# Patient Record
Sex: Male | Born: 1975 | Race: White | Hispanic: No | Marital: Single | Smoking: Current every day smoker
Health system: Southern US, Community
[De-identification: ages and names within clinical notes are randomized; demographics above are authoritative.]

## PROBLEM LIST (undated history)

## (undated) DIAGNOSIS — I10 Essential (primary) hypertension: Secondary | ICD-10-CM

## (undated) HISTORY — PX: FINGER SURGERY: SHX640

---

## 2002-09-07 ENCOUNTER — Emergency Department (HOSPITAL_COMMUNITY): Admission: AD | Admit: 2002-09-07 | Discharge: 2002-09-07 | Payer: Self-pay | Admitting: Emergency Medicine

## 2004-02-23 ENCOUNTER — Emergency Department (HOSPITAL_COMMUNITY): Admission: EM | Admit: 2004-02-23 | Discharge: 2004-02-24 | Payer: Self-pay | Admitting: Emergency Medicine

## 2006-06-20 ENCOUNTER — Emergency Department (HOSPITAL_COMMUNITY): Admission: EM | Admit: 2006-06-20 | Discharge: 2006-06-20 | Payer: Self-pay | Admitting: Emergency Medicine

## 2020-09-10 ENCOUNTER — Encounter (HOSPITAL_COMMUNITY): Payer: Self-pay | Admitting: Emergency Medicine

## 2020-09-10 ENCOUNTER — Emergency Department (HOSPITAL_COMMUNITY): Payer: Managed Care, Other (non HMO)

## 2020-09-10 ENCOUNTER — Emergency Department (HOSPITAL_COMMUNITY)
Admission: EM | Admit: 2020-09-10 | Discharge: 2020-09-10 | Disposition: A | Discharge status: Home / Self Care | Payer: Managed Care, Other (non HMO) | Attending: Emergency Medicine | Admitting: Emergency Medicine

## 2020-09-10 DIAGNOSIS — M25512 Pain in left shoulder: Secondary | ICD-10-CM | POA: Diagnosis present

## 2020-09-10 DIAGNOSIS — Y92410 Unspecified street and highway as the place of occurrence of the external cause: Secondary | ICD-10-CM | POA: Insufficient documentation

## 2020-09-10 DIAGNOSIS — M542 Cervicalgia: Secondary | ICD-10-CM | POA: Diagnosis not present

## 2020-09-10 MED ORDER — NAPROXEN 500 MG PO TABS: 500.0000 mg | ORAL_TABLET | Freq: Two times a day (BID) | ORAL | 0 refills | Status: DC | PRN

## 2020-09-10 MED ORDER — LIDOCAINE 5 % EX PTCH: 1.0000 | MEDICATED_PATCH | Freq: Every day | CUTANEOUS | 0 refills | Status: DC | PRN

## 2020-09-10 MED ORDER — METHOCARBAMOL 500 MG PO TABS: 500.0000 mg | ORAL_TABLET | Freq: Three times a day (TID) | ORAL | 0 refills | Status: DC | PRN
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
## 2022-08-30 ENCOUNTER — Emergency Department (HOSPITAL_COMMUNITY): Payer: Managed Care, Other (non HMO)

## 2022-08-30 ENCOUNTER — Observation Stay (HOSPITAL_COMMUNITY)
Admission: EM | Admit: 2022-08-30 | Discharge: 2022-08-31 | Disposition: A | Discharge status: Home / Self Care | Payer: Managed Care, Other (non HMO) | Attending: Surgery | Admitting: Surgery

## 2022-08-30 ENCOUNTER — Encounter (HOSPITAL_COMMUNITY): Payer: Self-pay

## 2022-08-30 ENCOUNTER — Other Ambulatory Visit: Payer: Self-pay

## 2022-08-30 DIAGNOSIS — Z79899 Other long term (current) drug therapy: Secondary | ICD-10-CM | POA: Insufficient documentation

## 2022-08-30 DIAGNOSIS — F17210 Nicotine dependence, cigarettes, uncomplicated: Secondary | ICD-10-CM | POA: Insufficient documentation

## 2022-08-30 DIAGNOSIS — I10 Essential (primary) hypertension: Secondary | ICD-10-CM | POA: Diagnosis not present

## 2022-08-30 DIAGNOSIS — K6289 Other specified diseases of anus and rectum: Principal | ICD-10-CM

## 2022-08-30 DIAGNOSIS — K611 Rectal abscess: Secondary | ICD-10-CM | POA: Diagnosis present

## 2022-08-30 DIAGNOSIS — K610 Anal abscess: Secondary | ICD-10-CM | POA: Diagnosis present

## 2022-08-30 HISTORY — DX: Essential (primary) hypertension: I10

## 2022-08-30 LAB — CBC WITH DIFFERENTIAL/PLATELET
Abs Immature Granulocytes: 0.04 10*3/uL (ref 0.00–0.07)
Basophils Absolute: 0.1 10*3/uL (ref 0.0–0.1)
Basophils Relative: 0 %
Eosinophils Absolute: 0.3 10*3/uL (ref 0.0–0.5)
Eosinophils Relative: 3 %
HCT: 44.1 % (ref 39.0–52.0)
Hemoglobin: 14.8 g/dL (ref 13.0–17.0)
Immature Granulocytes: 0 %
Lymphocytes Relative: 19 %
Lymphs Abs: 2.1 10*3/uL (ref 0.7–4.0)
MCH: 34 pg (ref 26.0–34.0)
MCHC: 33.6 g/dL (ref 30.0–36.0)
MCV: 101.4 fL — ABNORMAL HIGH (ref 80.0–100.0)
Monocytes Absolute: 0.8 10*3/uL (ref 0.1–1.0)
Monocytes Relative: 7 %
Neutro Abs: 8 10*3/uL — ABNORMAL HIGH (ref 1.7–7.7)
Neutrophils Relative %: 71 %
Platelets: 267 10*3/uL (ref 150–400)
RBC: 4.35 MIL/uL (ref 4.22–5.81)
RDW: 13.9 % (ref 11.5–15.5)
WBC: 11.4 10*3/uL — ABNORMAL HIGH (ref 4.0–10.5)
nRBC: 0 % (ref 0.0–0.2)

## 2022-08-30 LAB — COMPREHENSIVE METABOLIC PANEL
ALT: 30 U/L (ref 0–44)
AST: 25 U/L (ref 15–41)
Albumin: 3.2 g/dL — ABNORMAL LOW (ref 3.5–5.0)
Alkaline Phosphatase: 64 U/L (ref 38–126)
Anion gap: 10 (ref 5–15)
BUN: 7 mg/dL (ref 6–20)
CO2: 24 mmol/L (ref 22–32)
Calcium: 9 mg/dL (ref 8.9–10.3)
Chloride: 103 mmol/L (ref 98–111)
Creatinine, Ser: 0.96 mg/dL (ref 0.61–1.24)
GFR, Estimated: >60 mL/min (ref >60)
Glucose, Bld: 101 mg/dL — ABNORMAL HIGH (ref 70–99)
Potassium: 4 mmol/L (ref 3.5–5.1)
Sodium: 137 mmol/L (ref 135–145)
Total Bilirubin: 0.4 mg/dL (ref 0.3–1.2)
Total Protein: 7.4 g/dL (ref 6.5–8.1)

## 2022-08-30 LAB — URINALYSIS, ROUTINE W REFLEX MICROSCOPIC
Bilirubin Urine: NEGATIVE
Glucose, UA: NEGATIVE mg/dL
Hgb urine dipstick: NEGATIVE
Ketones, ur: NEGATIVE mg/dL
Leukocytes,Ua: NEGATIVE
Nitrite: NEGATIVE
Protein, ur: NEGATIVE mg/dL
Specific Gravity, Urine: 1.02 (ref 1.005–1.030)
pH: 5 (ref 5.0–8.0)

## 2022-08-30 LAB — HIV ANTIBODY (ROUTINE TESTING W REFLEX): HIV Screen 4th Generation wRfx: NONREACTIVE

## 2022-08-30 LAB — LIPASE, BLOOD: Lipase: 36 U/L (ref 11–51)

## 2022-08-30 MED ORDER — SODIUM CHLORIDE 0.9 % IV BOLUS
1000.0000 mL | Freq: Once | INTRAVENOUS | Status: AC
  Administered 2022-08-30: 1000 mL via INTRAVENOUS

## 2022-08-30 MED ORDER — DIPHENHYDRAMINE HCL 25 MG PO CAPS: 25.0000 mg | ORAL_CAPSULE | Freq: Four times a day (QID) | ORAL | Status: DC | PRN

## 2022-08-30 MED ORDER — LORAZEPAM 2 MG/ML IJ SOLN: 1.0000 mg | INTRAMUSCULAR | Status: DC | PRN

## 2022-08-30 MED ORDER — ONDANSETRON 4 MG PO TBDP: 4.0000 mg | ORAL_TABLET | Freq: Four times a day (QID) | ORAL | Status: DC | PRN

## 2022-08-30 MED ORDER — ACETAMINOPHEN 500 MG PO TABS
1000.0000 mg | ORAL_TABLET | Freq: Four times a day (QID) | ORAL | Status: DC | PRN
  Filled 2022-08-30: qty 2

## 2022-08-30 MED ORDER — OXYCODONE HCL 5 MG PO TABS
5.0000 mg | ORAL_TABLET | ORAL | Status: DC | PRN
  Administered 2022-08-31: 10 mg via ORAL
  Filled 2022-08-30: qty 2

## 2022-08-30 MED ORDER — SODIUM CHLORIDE 0.9 % IV SOLN: INTRAVENOUS | Status: DC

## 2022-08-30 MED ORDER — DOCUSATE SODIUM 100 MG PO CAPS
100.0000 mg | ORAL_CAPSULE | Freq: Two times a day (BID) | ORAL | Status: DC
  Administered 2022-08-30 – 2022-08-31 (×3): 100 mg via ORAL
  Filled 2022-08-30 (×3): qty 1

## 2022-08-30 MED ORDER — ADULT MULTIVITAMIN W/MINERALS CH
1.0000 | ORAL_TABLET | Freq: Every day | ORAL | Status: DC
  Administered 2022-08-30 – 2022-08-31 (×2): 1 via ORAL
  Filled 2022-08-30 (×2): qty 1

## 2022-08-30 MED ORDER — ONDANSETRON HCL 4 MG/2ML IJ SOLN: 4.0000 mg | Freq: Four times a day (QID) | INTRAMUSCULAR | Status: DC | PRN

## 2022-08-30 MED ORDER — DIPHENHYDRAMINE HCL 50 MG/ML IJ SOLN: 25.0000 mg | Freq: Four times a day (QID) | INTRAMUSCULAR | Status: DC | PRN

## 2022-08-30 MED ORDER — LORAZEPAM 1 MG PO TABS: 1.0000 mg | ORAL_TABLET | ORAL | Status: DC | PRN

## 2022-08-30 MED ORDER — METRONIDAZOLE 500 MG/100ML IV SOLN
500.0000 mg | Freq: Two times a day (BID) | INTRAVENOUS | Status: DC
  Administered 2022-08-30 – 2022-08-31 (×3): 500 mg via INTRAVENOUS
  Filled 2022-08-30 (×3): qty 100

## 2022-08-30 MED ORDER — HYDRALAZINE HCL 20 MG/ML IJ SOLN: 10.0000 mg | INTRAMUSCULAR | Status: DC | PRN

## 2022-08-30 MED ORDER — THIAMINE HCL 100 MG/ML IJ SOLN
100.0000 mg | Freq: Every day | INTRAMUSCULAR | Status: DC
  Filled 2022-08-30: qty 2

## 2022-08-30 MED ORDER — MORPHINE SULFATE (PF) 2 MG/ML IV SOLN: 2.0000 mg | INTRAVENOUS | Status: DC | PRN

## 2022-08-30 MED ORDER — SODIUM CHLORIDE 0.9 % IV SOLN
2.0000 g | INTRAVENOUS | Status: DC
  Administered 2022-08-30 – 2022-08-31 (×2): 2 g via INTRAVENOUS
  Filled 2022-08-30 (×2): qty 20

## 2022-08-30 MED ORDER — IOHEXOL 350 MG/ML SOLN
75.0000 mL | Freq: Once | INTRAVENOUS | Status: AC | PRN
  Administered 2022-08-30: 75 mL via INTRAVENOUS

## 2022-08-30 MED ORDER — THIAMINE MONONITRATE 100 MG PO TABS
100.0000 mg | ORAL_TABLET | Freq: Every day | ORAL | Status: DC
  Administered 2022-08-30 – 2022-08-31 (×2): 100 mg via ORAL
  Filled 2022-08-30 (×2): qty 1

## 2022-08-30 MED ORDER — FOLIC ACID 1 MG PO TABS
1.0000 mg | ORAL_TABLET | Freq: Every day | ORAL | Status: DC
  Administered 2022-08-30 – 2022-08-31 (×2): 1 mg via ORAL
  Filled 2022-08-30 (×2): qty 1

## 2022-08-30 NOTE — ED Notes (Signed)
Patient presents ambulatory to room with steady gait c/o rectal pain from hard stool on Saturday. Patient feels constipated but states he is holding it in due to the rectal pain. Patient a/o x 4 respirations even and non labored vs wnl bowel sounds present in all quads denies n/v/d.

## 2022-08-30 NOTE — ED Provider Notes (Signed)
[place] EMERGENCY DEPARTMENT AT [facility] Provider Note   CSN: [id number] Arrival date & time: 08/30/22  0150     History  Chief Complaint  Patient presents with   Constipation    Todd Hunt is a 47 y.o. male.  47 year old male presents to the ER with concern for rectal pain.  Patient states has been constipated for the past few days, tried Dulcolax without improvement.  Reports pain when trying to have bowel movement, feels like he is unable to completely empty his bladder.  No history of hemorrhoids, denies rectal bleeding or bloody stools, denies rectal itching.  No other complaints or concerns tonight. Has never had a colonoscopy.  Did do a Cologuard test but states he was sent a second 1 that he has not completed.       Home Medications Prior to Admission medications   Medication Sig Start Date End Date Taking? Authorizing Provider  lidocaine (LIDODERM) 5 % Place 1 patch onto the skin daily as needed. Apply patch to area most significant pain once per day.  Remove and discard patch within 12 hours of application. 09/10/20   Petrucelli, Samantha R, PA-C  methocarbamol (ROBAXIN) 500 MG tablet Take 1 tablet (500 mg total) by mouth every 8 (eight) hours as needed for muscle spasms. 09/10/20   Petrucelli, Samantha R, PA-C  naproxen (NAPROSYN) 500 MG tablet Take 1 tablet (500 mg total) by mouth 2 (two) times daily as needed for moderate pain. 09/10/20   Petrucelli, Pleas Koch, PA-C      Allergies    Patient has no known allergies.    Review of Systems   Review of Systems Negative except as per HPI Physical Exam Updated Vital Signs BP (!) 120/93 (BP Location: Right Arm)   Pulse 97   Temp 98.1 F (36.7 C) (Oral)   Resp 18   Ht 5\' 9"  (1.753 m)   Wt 63 kg   SpO2 100%   BMI 20.53 kg/m  Physical Exam Vitals and nursing note reviewed. Exam conducted with a chaperone present.  Constitutional:      General: He is not in acute distress.    Appearance: He  is well-developed. He is not diaphoretic.  HENT:     Head: Normocephalic and atraumatic.  Pulmonary:     Effort: Pulmonary effort is normal.  Abdominal:     Palpations: Abdomen is soft.     Tenderness: There is no abdominal tenderness.  Genitourinary:    Rectum: Tenderness present. No mass, anal fissure, external hemorrhoid or internal hemorrhoid. Normal anal tone.  Skin:    General: Skin is warm and dry.     Findings: No erythema or rash.  Neurological:     Mental Status: He is alert and oriented to person, place, and time.  Psychiatric:        Behavior: Behavior normal.     ED Results / Procedures / Treatments   Labs (all labs ordered are listed, but only abnormal results are displayed) Labs Reviewed  CBC WITH DIFFERENTIAL/PLATELET - Abnormal; Notable for the following components:      Result Value   WBC 11.4 (*)    MCV 101.4 (*)    Neutro Abs 8.0 (*)    All other components within normal limits  COMPREHENSIVE METABOLIC PANEL - Abnormal; Notable for the following components:   Glucose, Bld 101 (*)    Albumin 3.2 (*)    All other components within normal limits  LIPASE, BLOOD  URINALYSIS,  ROUTINE W REFLEX MICROSCOPIC    EKG None  Radiology No results found.  Procedures Procedures    Medications Ordered in ED Medications  sodium chloride 0.9 % bolus 1,000 mL (0 mLs Intravenous Stopped 08/30/22 4098)    ED Course/ Medical Decision Making/ A&P                             Medical Decision Making Amount and/or Complexity of Data Reviewed Labs: ordered. Radiology: ordered.   This patient presents to the ED for concern of rectal pain, this involves an extensive number of treatment options, and is a complaint that carries with it a high risk of complications and morbidity.  The differential diagnosis includes but not limited to hemorrhoid, fissure, abscess, urinary retention   Co morbidities that complicate the patient  evaluation  Hypertension   Additional history obtained:  External records from outside source obtained and reviewed including visit to urgent care dated 08/03/2022 for nausea and vomiting   Lab Tests:  I Ordered, and personally interpreted labs.  The pertinent results include: CBC with mild extensive white count of 1.4 with slight increase in neutrophils.  CMP without significant findings.  Urinalysis unremarkable and lipase normal.   Imaging Studies ordered:  I ordered imaging studies including CT abdomen pelvis with contrast Results pending at time of signout to oncoming provider   Problem List / ED Course / Critical interventions / Medication management  47 year old male with concern for rectal pain.  States he has been constipated, took Dulcolax without improvement, hurts to have bowel movement.  Rectal exam with chaperone present is unremarkable, stool is soft.  Does have rectal tenderness.  Plan is to evaluate for abscess with CT scan.  Results and disposition pending at time of signout to oncoming provider. I have reviewed the patients home medicines and have made adjustments as needed   Social Determinants of Health:  Has PCP   Test / Admission - Considered:  Disposition pending at time of signout to oncoming provider         Final Clinical Impression(s) / ED Diagnoses Final diagnoses:  Rectal pain    Rx / DC Orders ED Discharge Orders     None         Jeannie Fend, PA-C 08/30/22 0636    Glynn Octave, MD 08/30/22 [phone]

## 2022-08-30 NOTE — ED Notes (Signed)
ED TO INPATIENT HANDOFF REPORT  ED Nurse Name and Phone #: Corrie Dandy, RN  S Name/Age/Gender Todd Hunt 47 y.o. male Room/Bed: 040C/040C  Code Status   Code Status: Full Code  Home/SNF/Other Home Patient oriented to: self, place, time, and situation Is this baseline? Yes   Triage Complete: Triage complete  Chief Complaint Perianal abscess [K61.0]  Triage Note Pain in the rectum is not letting him have a bowel movement.  Last bowel movement was Saturday.   Allergies No Known Allergies  Level of Care/Admitting Diagnosis ED Disposition     ED Disposition  Admit   Condition  --   Comment  Hospital Area: [facility] [[id number]]  Level of Care: Med-Surg [16]  May place patient in observation at [facility] or [facility] if equivalent level of care is available:: No  Covid Evaluation: Asymptomatic - no recent exposure (last 10 days) testing not required  Diagnosis: Perianal abscess [308657]  Admitting Physician: CCS, MD [[id number]]  Attending Physician: CCS, MD [[id number]]  Bed request comments: 6N          B Medical/Surgery History Past Medical History:  Diagnosis Date   Hypertension    History reviewed. No pertinent surgical history.   A IV Location/Drains/Wounds Patient Lines/Drains/Airways Status     Active Line/Drains/Airways     Name Placement date Placement time Site Days   Peripheral IV 08/30/22 20 G Right Antecubital 08/30/22  0500  Antecubital  less than 1            Intake/Output Last 24 hours  Intake/Output Summary (Last 24 hours) at 08/30/2022 1410 Last data filed at 08/30/2022 8469 Gross per 24 hour  Intake 1000 ml  Output --  Net 1000 ml    Labs/Imaging Results for orders placed or performed during the hospital encounter of 08/30/22 (from the past 48 hour(s))  CBC with Differential     Status: Abnormal   Collection Time: 08/30/22  5:08 AM  Result Value Ref Range   WBC 11.4 (H) 4.0 - 10.5 K/uL   RBC 4.35 4.22 - 5.81  MIL/uL   Hemoglobin 14.8 13.0 - 17.0 g/dL   HCT 62.9 52.8 - 41.3 %   MCV 101.4 (H) 80.0 - 100.0 fL   MCH 34.0 26.0 - 34.0 pg   MCHC 33.6 30.0 - 36.0 g/dL   RDW 24.4 01.0 - 27.2 %   Platelets 267 150 - 400 K/uL   nRBC 0.0 0.0 - 0.2 %   Neutrophils Relative % 71 %   Neutro Abs 8.0 (H) 1.7 - 7.7 K/uL   Lymphocytes Relative 19 %   Lymphs Abs 2.1 0.7 - 4.0 K/uL   Monocytes Relative 7 %   Monocytes Absolute 0.8 0.1 - 1.0 K/uL   Eosinophils Relative 3 %   Eosinophils Absolute 0.3 0.0 - 0.5 K/uL   Basophils Relative 0 %   Basophils Absolute 0.1 0.0 - 0.1 K/uL   Immature Granulocytes 0 %   Abs Immature Granulocytes 0.04 0.00 - 0.07 K/uL    Comment: Performed at [facility], [address], [place], [place] [address]  Comprehensive metabolic panel     Status: Abnormal   Collection Time: 08/30/22  5:08 AM  Result Value Ref Range   Sodium 137 135 - 145 mmol/L   Potassium 4.0 3.5 - 5.1 mmol/L   Chloride 103 98 - 111 mmol/L   CO2 24 22 - 32 mmol/L   Glucose, Bld 101 (H) 70 - 99  mg/dL    Comment: Glucose reference range applies only to samples taken after fasting for at least 8 hours.   BUN 7 6 - 20 mg/dL   Creatinine, Ser 1.61 0.61 - 1.24 mg/dL   Calcium 9.0 8.9 - 09.6 mg/dL   Total Protein 7.4 6.5 - 8.1 g/dL   Albumin 3.2 (L) 3.5 - 5.0 g/dL   AST 25 15 - 41 U/L   ALT 30 0 - 44 U/L   Alkaline Phosphatase 64 38 - 126 U/L   Total Bilirubin 0.4 0.3 - 1.2 mg/dL   GFR, Estimated >04 >54 mL/min    Comment: (NOTE) Calculated using the CKD-EPI Creatinine Equation (2021)    Anion gap 10 5 - 15    Comment: Performed at [facility], [address], [place], [place] [address]  Lipase, blood     Status: None   Collection Time: 08/30/22  5:08 AM  Result Value Ref Range   Lipase 36 11 - 51 U/L    Comment: Performed at [facility], [address], [place], [place] [address]  Urinalysis, Routine w reflex microscopic -Urine, Clean Catch     Status: None   Collection Time:  08/30/22  5:17 AM  Result Value Ref Range   Color, Urine YELLOW YELLOW   APPearance CLEAR CLEAR   Specific Gravity, Urine 1.020 1.005 - 1.030   pH 5.0 5.0 - 8.0   Glucose, UA NEGATIVE NEGATIVE mg/dL   Hgb urine dipstick NEGATIVE NEGATIVE   Bilirubin Urine NEGATIVE NEGATIVE   Ketones, ur NEGATIVE NEGATIVE mg/dL   Protein, ur NEGATIVE NEGATIVE mg/dL   Nitrite NEGATIVE NEGATIVE   Leukocytes,Ua NEGATIVE NEGATIVE    Comment: Performed at [facility], [address] [place], [place] [address]  HIV Antibody (routine testing w rflx)     Status: None   Collection Time: 08/30/22 10:57 AM  Result Value Ref Range   HIV Screen 4th Generation wRfx Non Reactive Non Reactive    Comment: Performed at [facility] Lab, [address] [place], [place] [address]   CT ABDOMEN PELVIS W CONTRAST  Result Date: 08/30/2022 CLINICAL DATA:  47 year old male with rectal pain. Concern for perianal abscess or fistula. EXAM: CT ABDOMEN AND PELVIS WITH CONTRAST TECHNIQUE: Multidetector CT imaging of the abdomen and pelvis was performed using the standard protocol following bolus administration of intravenous contrast. RADIATION DOSE REDUCTION: This exam was performed according to the departmental dose-optimization program which includes automated exposure control, adjustment of the mA and/or kV according to patient size and/or use of iterative reconstruction technique. CONTRAST:  75mL OMNIPAQUE IOHEXOL 350 MG/ML SOLN COMPARISON:  None Available. FINDINGS: Lower chest: Negative. Hepatobiliary: Negative liver and gallbladder. Pancreas: Negative. Spleen: Negative. Adrenals/Urinary Tract: Normal adrenal glands. Solitary right kidney. Absent left kidney, unremarkable appearance of the left renal fossa. Stomach/Bowel: Stomach and duodenum appear negative. No dilated small bowel. Small fat containing umbilical hernia. Normal appendix tracking medial from the cecum on series 3, image 57. Negative large bowel through the  sigmoid colon and proximal rectum. Distal rectum remains decompressed approaching the anal verge. There is up to mild circumferential distal rectal wall thickening on series 3, image 91. Along the posterior anal rectal confluence there is a rounded low-density collection with faint rim enhancement (series 3, image 97). Internal simple fluid density, and the collection is about 2 cm diameter. This is in the midline, and there is fairly symmetric surrounding soft tissue thickening. Regional mild subcutaneous stranding  is slightly greater on the left. No soft tissue gas. Vascular/Lymphatic: Mild Aortoiliac calcified atherosclerosis. Normal caliber abdominal aorta. Major arterial structures appear to be patent along with the portal venous system when allowing for suboptimal intravascular contrast bolus. No lymphadenopathy. Reproductive: Negative visible scrotum, no generalized perineum inflammation. Other: No pelvis free fluid. Musculoskeletal: Lower lumbar chronic disc, endplate, and facet degeneration. Mild vacuum disc. No acute osseous abnormality identified. IMPRESSION: 1. Positive for a 2 cm Perianal Abscess, midline location at the distal rectum/anal verge with mild regional soft tissue swelling and inflammation. 2. No other acute or inflammatory process identified in the abdomen or pelvis. Solitary right kidney. Mild Aortic Atherosclerosis (ICD10-I70.0). Electronically Signed   By: Odessa Fleming M.D.   On: 08/30/2022 07:50    Pending Labs Unresulted Labs (From admission, onward)    None       Vitals/Pain Today's Vitals   08/30/22 1132 08/30/22 1215 08/30/22 1245 08/30/22 1330  BP:  (!) 143/88 119/72 137/81  Pulse:  79 84 80  Resp:  17 10 16   Temp: 98.3 F (36.8 C)     TempSrc: Oral     SpO2:  99% 100% 100%  Weight:      Height:      PainSc:        Isolation Precautions No active isolations  Medications Medications  0.9 %  sodium chloride infusion ( Intravenous New Bag/Given 08/30/22 1129)   metroNIDAZOLE (FLAGYL) IVPB 500 mg (0 mg Intravenous Stopped 08/30/22 1234)  cefTRIAXone (ROCEPHIN) 2 g in sodium chloride 0.9 % 100 mL IVPB (0 g Intravenous Stopped 08/30/22 1234)  acetaminophen (TYLENOL) tablet 1,000 mg (has no administration in time range)  morphine (PF) 2 MG/ML injection 2-4 mg (has no administration in time range)  diphenhydrAMINE (BENADRYL) capsule 25 mg (has no administration in time range)    Or  diphenhydrAMINE (BENADRYL) injection 25 mg (has no administration in time range)  docusate sodium (COLACE) capsule 100 mg (100 mg Oral Given 08/30/22 1114)  ondansetron (ZOFRAN-ODT) disintegrating tablet 4 mg (has no administration in time range)    Or  ondansetron (ZOFRAN) injection 4 mg (has no administration in time range)  hydrALAZINE (APRESOLINE) injection 10 mg (has no administration in time range)  oxyCODONE (Oxy IR/ROXICODONE) immediate release tablet 5-10 mg (has no administration in time range)  LORazepam (ATIVAN) tablet 1-4 mg (has no administration in time range)    Or  LORazepam (ATIVAN) injection 1-4 mg (has no administration in time range)  thiamine (VITAMIN B1) tablet 100 mg (100 mg Oral Given 08/30/22 1115)    Or  thiamine (VITAMIN B1) injection 100 mg ( Intravenous See Alternative 08/30/22 1115)  folic acid (FOLVITE) tablet 1 mg (1 mg Oral Given 08/30/22 1114)  multivitamin with minerals tablet 1 tablet (1 tablet Oral Given 08/30/22 1114)  sodium chloride 0.9 % bolus 1,000 mL (0 mLs Intravenous Stopped 08/30/22 0626)  iohexol (OMNIPAQUE) 350 MG/ML injection 75 mL (75 mLs Intravenous Contrast Given 08/30/22 0705)    Mobility walks     Focused Assessments Pt is on room air.    R Recommendations: See Admitting Provider Note  Report given to:   Additional Notes: pt is AAOx4. Pt is ambulatory to bathroom.

## 2022-08-30 NOTE — ED Triage Notes (Signed)
Pain in the rectum is not letting him have a bowel movement.  Last bowel movement was Saturday.

## 2022-08-30 NOTE — H&P (Signed)
Todd Hunt 06-23-1975  [id number].    Requesting MD: Lynelle Doctor, MD Chief Complaint/Reason for Consult: perianal abscess  HPI:  Todd Hunt is a 47 y/o M with PMH HTN, tobacco use, and EtOH use who presents with 3-4 days of perianal pain. States he tried to have a BM on Saturday and it was painful and he was unsuccessful. Started taking miralax and had some nonbloody stools Monday/Tuesday. Continues to have rectal pain. Denies fever, chills. Denies similar pain in the past. Denies any trauma or foreign body to the area.   Substance: 1.5 ppd cigarettes, reports drinking a fifth of liquor every 3 days. Denies other drug use. Employment: Engineer, civil (consulting), Estate agent Blood thinners: none    ROS: ROS  History reviewed. No pertinent family history.  Past Medical History:  Diagnosis Date   Hypertension     History reviewed. No pertinent surgical history.  Social History:  reports that he has been smoking cigarettes. He has a 25.00 pack-year smoking history. He has never used smokeless tobacco. He reports current alcohol use of about 2.0 standard drinks of alcohol per week. He reports that he does not use drugs.  Allergies: No Known Allergies  (Not in a hospital admission)    Physical Exam: Blood pressure 137/84, pulse 89, temperature 98.1 F (36.7 C), temperature source Oral, resp. rate 11, height 5\' 9"  (1.753 m), weight 63 kg, SpO2 100 %. General: Pleasant white male , NAD HEENT: head -normocephalic, atraumatic; Eyes: PERRLA, no conjunctival injection Neck- Trachea is midline CV- RRR, normal S1/S2, no M/R/G, no lower extremity edema  Pulm- breathing is non-labored ORA. Abd- soft, protuberant, NT/ND, rash present GU- there is bulging and fullness on external rectal exam in the 6 o'clock position, DRE without gross blood, palpable fullness posteriorly consistent with posterior perianal abscess. Appropraitely tender. No puruence or draiange. MSK- UE/LE  symmetrical, no cyanosis, clubbing, or edema. Neuro- CN II-XII grossly in tact, no paresthesias. Psych- Alert and Oriented x3 with appropriate affect Skin: warm and dry, lower extremity rash present - he says this is poison ivy and he has been treating with a topical soap he got from a friend.   Results for orders placed or performed during the hospital encounter of 08/30/22 (from the past 48 hour(s))  CBC with Differential     Status: Abnormal   Collection Time: 08/30/22  5:08 AM  Result Value Ref Range   WBC 11.4 (H) 4.0 - 10.5 K/uL   RBC 4.35 4.22 - 5.81 MIL/uL   Hemoglobin 14.8 13.0 - 17.0 g/dL   HCT 40.9 81.1 - 91.4 %   MCV 101.4 (H) 80.0 - 100.0 fL   MCH 34.0 26.0 - 34.0 pg   MCHC 33.6 30.0 - 36.0 g/dL   RDW 78.2 95.6 - 21.3 %   Platelets 267 150 - 400 K/uL   nRBC 0.0 0.0 - 0.2 %   Neutrophils Relative % 71 %   Neutro Abs 8.0 (H) 1.7 - 7.7 K/uL   Lymphocytes Relative 19 %   Lymphs Abs 2.1 0.7 - 4.0 K/uL   Monocytes Relative 7 %   Monocytes Absolute 0.8 0.1 - 1.0 K/uL   Eosinophils Relative 3 %   Eosinophils Absolute 0.3 0.0 - 0.5 K/uL   Basophils Relative 0 %   Basophils Absolute 0.1 0.0 - 0.1 K/uL   Immature Granulocytes 0 %   Abs Immature Granulocytes 0.04 0.00 - 0.07 K/uL    Comment: Performed at [facility]  Lab, [address], [place], [place] [address]  Comprehensive metabolic panel     Status: Abnormal   Collection Time: 08/30/22  5:08 AM  Result Value Ref Range   Sodium 137 135 - 145 mmol/L   Potassium 4.0 3.5 - 5.1 mmol/L   Chloride 103 98 - 111 mmol/L   CO2 24 22 - 32 mmol/L   Glucose, Bld 101 (H) 70 - 99 mg/dL    Comment: Glucose reference range applies only to samples taken after fasting for at least 8 hours.   BUN 7 6 - 20 mg/dL   Creatinine, Ser 6.04 0.61 - 1.24 mg/dL   Calcium 9.0 8.9 - 54.0 mg/dL   Total Protein 7.4 6.5 - 8.1 g/dL   Albumin 3.2 (L) 3.5 - 5.0 g/dL   AST 25 15 - 41 U/L   ALT 30 0 - 44 U/L   Alkaline Phosphatase 64 38 - 126 U/L    Total Bilirubin 0.4 0.3 - 1.2 mg/dL   GFR, Estimated >98 >11 mL/min    Comment: (NOTE) Calculated using the CKD-EPI Creatinine Equation (2021)    Anion gap 10 5 - 15    Comment: Performed at [facility], [address] [place], [place] [address]  Lipase, blood     Status: None   Collection Time: 08/30/22  5:08 AM  Result Value Ref Range   Lipase 36 11 - 51 U/L    Comment: Performed at [facility], [address] [place], [place] [address]  Urinalysis, Routine w reflex microscopic -Urine, Clean Catch     Status: None   Collection Time: 08/30/22  5:17 AM  Result Value Ref Range   Color, Urine YELLOW YELLOW   APPearance CLEAR CLEAR   Specific Gravity, Urine 1.020 1.005 - 1.030   pH 5.0 5.0 - 8.0   Glucose, UA NEGATIVE NEGATIVE mg/dL   Hgb urine dipstick NEGATIVE NEGATIVE   Bilirubin Urine NEGATIVE NEGATIVE   Ketones, ur NEGATIVE NEGATIVE mg/dL   Protein, ur NEGATIVE NEGATIVE mg/dL   Nitrite NEGATIVE NEGATIVE   Leukocytes,Ua NEGATIVE NEGATIVE    Comment: Performed at [facility], [address], [place], [place] [address]   CT ABDOMEN PELVIS W CONTRAST  Result Date: 08/30/2022 CLINICAL DATA:  47 year old male with rectal pain. Concern for perianal abscess or fistula. EXAM: CT ABDOMEN AND PELVIS WITH CONTRAST TECHNIQUE: Multidetector CT imaging of the abdomen and pelvis was performed using the standard protocol following bolus administration of intravenous contrast. RADIATION DOSE REDUCTION: This exam was performed according to the departmental dose-optimization program which includes automated exposure control, adjustment of the mA and/or kV according to patient size and/or use of iterative reconstruction technique. CONTRAST:  75mL OMNIPAQUE IOHEXOL 350 MG/ML SOLN COMPARISON:  None Available. FINDINGS: Lower chest: Negative. Hepatobiliary: Negative liver and gallbladder. Pancreas: Negative. Spleen: Negative. Adrenals/Urinary Tract: Normal adrenal glands. Solitary  right kidney. Absent left kidney, unremarkable appearance of the left renal fossa. Stomach/Bowel: Stomach and duodenum appear negative. No dilated small bowel. Small fat containing umbilical hernia. Normal appendix tracking medial from the cecum on series 3, image 57. Negative large bowel through the sigmoid colon and proximal rectum. Distal rectum remains decompressed approaching the anal verge. There is up to mild circumferential distal rectal wall thickening on series 3, image 91. Along the posterior anal rectal confluence there is a rounded low-density collection with faint rim enhancement (series 3, image 97). Internal simple fluid density, and the collection is about 2 cm  diameter. This is in the midline, and there is fairly symmetric surrounding soft tissue thickening. Regional mild subcutaneous stranding is slightly greater on the left. No soft tissue gas. Vascular/Lymphatic: Mild Aortoiliac calcified atherosclerosis. Normal caliber abdominal aorta. Major arterial structures appear to be patent along with the portal venous system when allowing for suboptimal intravascular contrast bolus. No lymphadenopathy. Reproductive: Negative visible scrotum, no generalized perineum inflammation. Other: No pelvis free fluid. Musculoskeletal: Lower lumbar chronic disc, endplate, and facet degeneration. Mild vacuum disc. No acute osseous abnormality identified. IMPRESSION: 1. Positive for a 2 cm Perianal Abscess, midline location at the distal rectum/anal verge with mild regional soft tissue swelling and inflammation. 2. No other acute or inflammatory process identified in the abdomen or pelvis. Solitary right kidney. Mild Aortic Atherosclerosis (ICD10-I70.0). Electronically Signed   By: Odessa Fleming M.D.   On: 08/30/2022 07:50      Assessment/Plan Posterior perianal abscess - AFVSS, WBC 11.4 - CT scan shows 2 cm posterior perianal abscess with surrounding inflammatory changes. Exam confirms this.  - recommend admission  for EUA, I&D. Will discuss timing of surgery with MD.  - start Rocephin/flagyl   HTN- PRN hydralazine EtOH use - CIWA Tobacco abuse   FEN - NPO, IVF VTE - SCD's, lovenox  ID - Rocephin/Flagyl Admit - CCS observation    I reviewed nursing notes, ED provider notes, last 24 h vitals and pain scores, last 48 h intake and output, last 24 h labs and trends, and last 24 h imaging results.  Adam Phenix, [facility] 08/30/2022, 9:37 AM Please see Amion for pager number during day hours 7:00am-4:30pm or 7:00am -11:30am on weekends

## 2022-08-30 NOTE — ED Provider Notes (Signed)
  Physical Exam  BP 137/84 (BP Location: Left Arm)   Pulse 89   Temp 98.1 F (36.7 C) (Oral)   Resp 11   Ht 5\' 9"  (1.753 m)   Wt 63 kg   SpO2 100%   BMI 20.53 kg/m   Physical Exam Vitals and nursing note reviewed. Exam conducted with a chaperone present Idalia Needle, Charity fundraiser).  Constitutional:      General: He is not in acute distress.    Appearance: Normal appearance. He is normal weight. He is not ill-appearing.  HENT:     Head: Normocephalic and atraumatic.  Pulmonary:     Effort: Pulmonary effort is normal. No respiratory distress.  Abdominal:     General: Abdomen is flat.  Genitourinary:    Comments: No anal fissures or external hemorrhoids. DRE without rectal masses or stepoffs. Soft brown stool noted in rectal vault Musculoskeletal:        General: Normal range of motion.     Cervical back: Neck supple.  Skin:    General: Skin is warm and dry.  Neurological:     Mental Status: He is alert and oriented to person, place, and time.  Psychiatric:        Mood and Affect: Mood normal.        Behavior: Behavior normal.     Procedures  Procedures  ED Course / MDM   Clinical Course as of 08/30/22 1002  Wed Aug 30, 2022  1610 Spoke with general surgery Hosie Spangle PA-C who will evaluate the patient [AS]    Clinical Course User Index [AS] Lula Olszewski Edsel Petrin, PA-C   Medical Decision Making Amount and/or Complexity of Data Reviewed Labs: ordered. Radiology: ordered.  Risk Prescription drug management.  Assumed care at shift change from previous provider.  Please see her note for full HPI.  In short, 47 year old male presenting to the ED complaining of constipation and rectal pain. He has also intermittently had difficulty initiating urination. He was able to urinate in the ED today.  Plan at the time of shift change is pending CT abdomen pelvis to rule out rectal abscess.  Per previous provider, external rectal exam unremarkable, DRE unremarkable as well.   0800- CT  abdomen pelvis positive for 3 cm perianal abscess.  I personally reassessed the patient.  He states his last bowel movement was yesterday.  He took Dulcolax at home which helped.  He does not have significant pain with bowel movements but has quite a bit of discomfort when sitting on his buttocks and walking.  No rectal bleeding.  I performed a repeat rectal exam which was largely unremarkable.  I am unable to appreciate any rectal or perianal masses.  Postvoid residual volume of the bladder is approximately 200 cc.  Will consult with general surgery for further recommendations  1000- patient assessed by general surgery. Will be admitted with subsequent surgical drainage of perianal abscess.   Note: Portions of this report may have been transcribed using voice recognition software. Every effort was made to ensure accuracy; however, inadvertent computerized transcription errors may still be present.    Michelle Piper, Cordelia Poche 08/30/22 1002    Linwood Dibbles, MD 08/31/22 1035

## 2022-08-31 ENCOUNTER — Observation Stay (HOSPITAL_BASED_OUTPATIENT_CLINIC_OR_DEPARTMENT_OTHER): Payer: Managed Care, Other (non HMO) | Admitting: Certified Registered Nurse Anesthetist

## 2022-08-31 ENCOUNTER — Encounter (HOSPITAL_COMMUNITY)
Admission: EM | Disposition: A | Discharge status: Home / Self Care | Payer: Self-pay | Source: Home / Self Care | Attending: Emergency Medicine

## 2022-08-31 ENCOUNTER — Encounter (HOSPITAL_COMMUNITY): Payer: Self-pay

## 2022-08-31 ENCOUNTER — Observation Stay (HOSPITAL_COMMUNITY): Payer: Managed Care, Other (non HMO) | Admitting: Certified Registered Nurse Anesthetist

## 2022-08-31 ENCOUNTER — Other Ambulatory Visit: Payer: Self-pay

## 2022-08-31 DIAGNOSIS — K610 Anal abscess: Secondary | ICD-10-CM

## 2022-08-31 HISTORY — PX: IRRIGATION AND DEBRIDEMENT ABSCESS: SHX5252

## 2022-08-31 HISTORY — PX: INCISION AND DRAINAGE PERIRECTAL ABSCESS: SHX1804

## 2022-08-31 LAB — SURGICAL PCR SCREEN
MRSA, PCR: NEGATIVE
Staphylococcus aureus: NEGATIVE

## 2022-08-31 SURGERY — INCISION AND DRAINAGE, ABSCESS, PERIRECTAL
Anesthesia: General | Site: Rectum

## 2022-08-31 MED ORDER — LACTATED RINGERS IV SOLN: INTRAVENOUS | Status: DC

## 2022-08-31 MED ORDER — AMOXICILLIN-POT CLAVULANATE 875-125 MG PO TABS: 1.0000 | ORAL_TABLET | Freq: Two times a day (BID) | ORAL | 0 refills | Status: AC

## 2022-08-31 MED ORDER — MIDAZOLAM HCL 5 MG/5ML IJ SOLN
INTRAMUSCULAR | Status: DC | PRN
  Administered 2022-08-31: 2 mg via INTRAVENOUS

## 2022-08-31 MED ORDER — OXYCODONE HCL 5 MG PO TABS: 10.0000 mg | ORAL_TABLET | Freq: Four times a day (QID) | ORAL | Status: DC | PRN

## 2022-08-31 MED ORDER — 0.9 % SODIUM CHLORIDE (POUR BTL) OPTIME
TOPICAL | Status: DC | PRN
  Administered 2022-08-31: 1000 mL

## 2022-08-31 MED ORDER — MIDAZOLAM HCL 2 MG/2ML IJ SOLN
INTRAMUSCULAR | Status: AC
  Filled 2022-08-31: qty 2

## 2022-08-31 MED ORDER — FENTANYL CITRATE (PF) 250 MCG/5ML IJ SOLN
INTRAMUSCULAR | Status: DC | PRN
  Administered 2022-08-31: 100 ug via INTRAVENOUS
  Administered 2022-08-31: 50 ug via INTRAVENOUS

## 2022-08-31 MED ORDER — FENTANYL CITRATE (PF) 250 MCG/5ML IJ SOLN
INTRAMUSCULAR | Status: AC
  Filled 2022-08-31: qty 5

## 2022-08-31 MED ORDER — PHENYLEPHRINE 80 MCG/ML (10ML) SYRINGE FOR IV PUSH (FOR BLOOD PRESSURE SUPPORT)
PREFILLED_SYRINGE | INTRAVENOUS | Status: DC | PRN
  Administered 2022-08-31: 80 ug via INTRAVENOUS

## 2022-08-31 MED ORDER — PROPOFOL 10 MG/ML IV BOLUS
INTRAVENOUS | Status: DC | PRN
  Administered 2022-08-31: 150 mg via INTRAVENOUS

## 2022-08-31 MED ORDER — BUPIVACAINE-EPINEPHRINE 0.25% -1:200000 IJ SOLN
INTRAMUSCULAR | Status: DC | PRN
  Administered 2022-08-31: 6 mL

## 2022-08-31 MED ORDER — ROCURONIUM BROMIDE 10 MG/ML (PF) SYRINGE
PREFILLED_SYRINGE | INTRAVENOUS | Status: DC | PRN
  Administered 2022-08-31: 50 mg via INTRAVENOUS

## 2022-08-31 MED ORDER — PROPOFOL 10 MG/ML IV BOLUS
INTRAVENOUS | Status: AC
  Filled 2022-08-31: qty 20

## 2022-08-31 MED ORDER — ORAL CARE MOUTH RINSE: 15.0000 mL | Freq: Once | OROMUCOSAL | Status: AC

## 2022-08-31 MED ORDER — FENTANYL CITRATE (PF) 100 MCG/2ML IJ SOLN: 25.0000 ug | INTRAMUSCULAR | Status: DC | PRN

## 2022-08-31 MED ORDER — ONDANSETRON HCL 4 MG/2ML IJ SOLN
INTRAMUSCULAR | Status: AC
  Filled 2022-08-31: qty 2

## 2022-08-31 MED ORDER — LIDOCAINE 2% (20 MG/ML) 5 ML SYRINGE
INTRAMUSCULAR | Status: DC | PRN
  Administered 2022-08-31: 60 mg via INTRAVENOUS

## 2022-08-31 MED ORDER — SUGAMMADEX SODIUM 200 MG/2ML IV SOLN
INTRAVENOUS | Status: DC | PRN
  Administered 2022-08-31: 100 mg via INTRAVENOUS
  Administered 2022-08-31: 200 mg via INTRAVENOUS

## 2022-08-31 MED ORDER — ACETAMINOPHEN 500 MG PO TABS: 1000.0000 mg | ORAL_TABLET | Freq: Four times a day (QID) | ORAL | Status: AC | PRN

## 2022-08-31 MED ORDER — DEXAMETHASONE SODIUM PHOSPHATE 10 MG/ML IJ SOLN
INTRAMUSCULAR | Status: DC | PRN
  Administered 2022-08-31: 5 mg via INTRAVENOUS

## 2022-08-31 MED ORDER — IBUPROFEN 200 MG PO TABS: 600.0000 mg | ORAL_TABLET | Freq: Three times a day (TID) | ORAL | Status: AC | PRN

## 2022-08-31 MED ORDER — AMISULPRIDE (ANTIEMETIC) 5 MG/2ML IV SOLN: 10.0000 mg | Freq: Once | INTRAVENOUS | Status: DC | PRN

## 2022-08-31 MED ORDER — ALBUTEROL SULFATE HFA 108 (90 BASE) MCG/ACT IN AERS
INHALATION_SPRAY | RESPIRATORY_TRACT | Status: DC | PRN
  Administered 2022-08-31: 6 via RESPIRATORY_TRACT

## 2022-08-31 MED ORDER — ACETAMINOPHEN 500 MG PO TABS
1000.0000 mg | ORAL_TABLET | Freq: Once | ORAL | Status: AC
  Administered 2022-08-31: 1000 mg via ORAL
  Filled 2022-08-31: qty 2

## 2022-08-31 MED ORDER — CHLORHEXIDINE GLUCONATE 0.12 % MT SOLN
15.0000 mL | Freq: Once | OROMUCOSAL | Status: AC
  Administered 2022-08-31: 15 mL via OROMUCOSAL

## 2022-08-31 MED ORDER — DEXAMETHASONE SODIUM PHOSPHATE 10 MG/ML IJ SOLN
INTRAMUSCULAR | Status: AC
  Filled 2022-08-31: qty 1

## 2022-08-31 MED ORDER — ONDANSETRON HCL 4 MG/2ML IJ SOLN
INTRAMUSCULAR | Status: DC | PRN
  Administered 2022-08-31: 4 mg via INTRAVENOUS

## 2022-08-31 MED ORDER — BUPIVACAINE-EPINEPHRINE (PF) 0.25% -1:200000 IJ SOLN
INTRAMUSCULAR | Status: AC
  Filled 2022-08-31: qty 30

## 2022-08-31 SURGICAL SUPPLY — 30 items
BRIEF MESH DISP LRG (UNDERPADS AND DIAPERS) ×1 IMPLANT
CANISTER SUCT 3000ML PPV (MISCELLANEOUS) ×1 IMPLANT
COVER SURGICAL LIGHT HANDLE (MISCELLANEOUS) ×1 IMPLANT
ELECT REM PT RETURN 9FT ADLT (ELECTROSURGICAL) ×1
ELECTRODE REM PT RTRN 9FT ADLT (ELECTROSURGICAL) ×1 IMPLANT
GAUZE PACKING IODOFORM 1/2INX (GAUZE/BANDAGES/DRESSINGS) IMPLANT
GAUZE PAD ABD 8X10 STRL (GAUZE/BANDAGES/DRESSINGS) ×1 IMPLANT
GAUZE SPONGE 4X4 12PLY STRL (GAUZE/BANDAGES/DRESSINGS) ×1 IMPLANT
GLOVE BIO SURGEON STRL SZ8 (GLOVE) ×1 IMPLANT
GLOVE BIOGEL PI IND STRL 8 (GLOVE) ×1 IMPLANT
GOWN STRL REUS W/ TWL LRG LVL3 (GOWN DISPOSABLE) ×1 IMPLANT
GOWN STRL REUS W/ TWL XL LVL3 (GOWN DISPOSABLE) ×1 IMPLANT
GOWN STRL REUS W/TWL LRG LVL3 (GOWN DISPOSABLE) ×1
GOWN STRL REUS W/TWL XL LVL3 (GOWN DISPOSABLE) ×1
KIT BASIN OR (CUSTOM PROCEDURE TRAY) ×1 IMPLANT
KIT TURNOVER KIT B (KITS) ×1 IMPLANT
NDL HYPO 25GX1X1/2 BEV (NEEDLE) IMPLANT
NEEDLE HYPO 25GX1X1/2 BEV (NEEDLE) ×1 IMPLANT
NS IRRIG 1000ML POUR BTL (IV SOLUTION) ×1 IMPLANT
PACK LITHOTOMY IV (CUSTOM PROCEDURE TRAY) ×1 IMPLANT
PAD ARMBOARD 7.5X6 YLW CONV (MISCELLANEOUS) ×1 IMPLANT
PENCIL SMOKE EVACUATOR (MISCELLANEOUS) ×1 IMPLANT
SPONGE T-LAP 18X18 ~~LOC~~+RFID (SPONGE) ×1 IMPLANT
SYR BULB IRRIG 60ML STRL (SYRINGE) ×1 IMPLANT
SYR CONTROL 10ML LL (SYRINGE) IMPLANT
TOWEL GREEN STERILE (TOWEL DISPOSABLE) ×1 IMPLANT
TOWEL GREEN STERILE FF (TOWEL DISPOSABLE) ×1 IMPLANT
TUBE CONNECTING 12X1/4 (SUCTIONS) ×1 IMPLANT
UNDERPAD 30X36 HEAVY ABSORB (UNDERPADS AND DIAPERS) ×1 IMPLANT
YANKAUER SUCT BULB TIP NO VENT (SUCTIONS) ×1 IMPLANT

## 2022-08-31 NOTE — Discharge Instructions (Addendum)
Remove packing tomorrow.  You may leave this unpacked.  You may place gauze or use a pad to catch any drainage you may have until this heals. You may take Sitz bathes as needed to help keep this area clean, especially after having a bowel movement.  Because you are on Naltrexone, we are unable to prescribe you any narcotic pain medications as they interact with each other.  Please contact your pain management clinic to discuss further needs if you need anything more than your baseline medication, Tylenol, and Ibuprofen.

## 2022-08-31 NOTE — Anesthesia Postprocedure Evaluation (Signed)
Anesthesia Post Note  Patient: Todd Hunt  Procedure(s) Performed: EXAM UNDER ANESTHESIA (Rectum) IRRIGATION AND DEBRIDEMENT PERIANAL ABSCESS (Rectum)     Patient location during evaluation: PACU Anesthesia Type: General Level of consciousness: awake and alert Pain management: pain level controlled Vital Signs Assessment: post-procedure vital signs reviewed and stable Respiratory status: spontaneous breathing, nonlabored ventilation, respiratory function stable and patient connected to nasal cannula oxygen Cardiovascular status: blood pressure returned to baseline and stable Postop Assessment: no apparent nausea or vomiting Anesthetic complications: no  No notable events documented.  Last Vitals:  Vitals:   08/31/22 1245 08/31/22 1254  BP: 129/73 (!) 127/91  Pulse: 79 76  Resp: 11   Temp:  36.7 C  SpO2: 95% 98%    Last Pain:  Vitals:   08/31/22 1254  TempSrc:   PainSc: 0-No pain                 Kennieth Rad

## 2022-08-31 NOTE — Op Note (Signed)
Preoperative diagnosis: Complex perirectal abscess  Postop diagnosis: Same  Procedure: Incision and drainage of complex patient Perirectal abscess  Surgeon: Harriette Bouillon, MD  Anesthesia: 0.25% Marcaine plain  EBL: Minimal  Drains: None  Indications for procedure: The patient is a 47 year old male with rectal pain.  Seen yesterday in emergency room and a large posterior midline Abscess.  He was admitted for IV antibiotics and presents to the operating today for drainage.The procedure has been discussed with the patient.  Alternative therapies have been discussed with the patient.  Operative risks include bleeding,  Infection,  Organ injury,  Nerve injury,  Blood vessel injury,  fecal incontinence, fistula,  DVT,  Pulmonary embolism,  Death,  And possible reoperation.  Medical management risks include worsening of present situation.  The success of the procedure is 50 -90 % at treating patients symptoms.  The patient understands and agrees to proceed.   Description of procedure: The patient was met in the holding area and questions were answered.  The procedure was reviewed.  Was taken back to the operating.  He was placed supine upon the operating table.  After induction of general anesthesia, the patient was placed in lithotomy and appropriately padded.  The perineum was then prepped and draped in a sterile fashion timeout performed.  He had received appropriate preoperative blocks.  A digital examination was done.  This was normal.  There was fullness in the posterior midline corresponding to the CT findings.  A cruciate incision was made in the posterior midline without fluctuance and there is a large amount of pus that evacuated from this.  I placed my finger and broke up some loculations as well.  This appeared to be superficial to the sphincter mechanism.  Sphincter was left intact.  No evidence of fistula on anoscopy.  He did have some internal hemorrhoids and 2 columns by anoscopy otherwise  no fissures or other abnormality.  No mass.  This was packed with half-inch packing.  Hemostasis was achieved.  A large ABD pad was placed.  All counts were found to be correct.  He was then taken out of lithotomy placed upon.  He was extubated and taken to PACU in satisfactory condition.  All counts were correct.

## 2022-08-31 NOTE — Anesthesia Procedure Notes (Signed)
Procedure Name: Intubation Date/Time: 08/31/2022 11:27 AM  Performed by: Waynard Edwards, CRNAPre-anesthesia Checklist: Patient identified, Emergency Drugs available, Suction available and Patient being monitored Patient Re-evaluated:Patient Re-evaluated prior to induction Oxygen Delivery Method: Circle system utilized Preoxygenation: Pre-oxygenation with 100% oxygen Induction Type: IV induction Ventilation: Mask ventilation without difficulty Laryngoscope Size: Miller and 3 Grade View: Grade I Tube type: Oral Tube size: 7.5 mm Number of attempts: 1 Airway Equipment and Method: Stylet and Oral airway Placement Confirmation: ETT inserted through vocal cords under direct vision, positive ETCO2 and breath sounds checked- equal and bilateral Secured at: 22 cm Tube secured with: Tape Dental Injury: Teeth and Oropharynx as per pre-operative assessment

## 2022-08-31 NOTE — Discharge Summary (Signed)
    Patient ID: Todd Hunt [id number] 19-Dec-1975 47 y.o.  Admit date: 08/30/2022 Discharge date: 08/31/2022  Admitting Diagnosis: Perirectal abscess  Discharge Diagnosis Patient Active Problem List   Diagnosis Date Noted   Perianal abscess 08/30/2022  S/p I&D  Consultants none  Reason for Admission: Todd Hunt is a 47 y/o M with PMH HTN, tobacco use, and EtOH use who presents with 3-4 days of perianal pain. States he tried to have a BM on Saturday and it was painful and he was unsuccessful. Started taking miralax and had some nonbloody stools Monday/Tuesday. Continues to have rectal pain. Denies fever, chills. Denies similar pain in the past. Denies any trauma or foreign body to the area.    Substance: 1.5 ppd cigarettes, reports drinking a fifth of liquor every 3 days. Denies other drug use. Employment: [employer] technologist, Estate agent Blood thinners: none   Procedures I&D of perirectal abscess, Dr. Luisa Hart 7/11  Hospital Course:  The patient was admitted and started on abx therapy.  He underwent the above procedure.  He tolerated this well and was stable for DC home on POD 0.  He was voiding and had good pain control.  We discussed I am unable to prescribe opioids with his naltrexone.  He is advised to resume this as well as OTC Tylenol/Ibuprofen.  He is encouraged to call his pain medicine clinic if he needs further assistance with pain control.   Allergies as of 08/31/2022   No Known Allergies      Medication List     TAKE these medications    acetaminophen 500 MG tablet Commonly known as: TYLENOL Take 2 tablets (1,000 mg total) by mouth every 6 (six) hours as needed for mild pain, moderate pain, headache or fever.   amoxicillin-clavulanate 875-125 MG tablet Commonly known as: AUGMENTIN Take 1 tablet by mouth 2 (two) times daily for 5 days.   ibuprofen 200 MG tablet Commonly known as: Motrin IB Take 3 tablets (600 mg total) by mouth every 8  (eight) hours as needed.   naltrexone 50 MG tablet Commonly known as: DEPADE Take 50 mg by mouth every evening.   rosuvastatin 5 MG tablet Commonly known as: CRESTOR Take 5 mg by mouth daily. When compliant   valsartan 160 MG tablet Commonly known as: DIOVAN Take 160 mg by mouth daily.          Follow-up Information     Maczis, Hedda Slade, PA-C Follow up in 3 week(s).   Specialty: General Surgery Why: Office will call you with a follow up appointment, If you don't hear from the office, please call, Arrive 30 minutes prior to your appointment time, Please bring your insurance card and photo ID Contact information: [address] [place] [address] [facility] [place] [address] [phone]                 Signed: Barnetta Chapel, [facility] 08/31/2022, 3:00 PM Please see Amion for pager number during day hours 7:00am-4:30pm, 7-11:30am on Weekends

## 2022-08-31 NOTE — Transfer of Care (Signed)
Immediate Anesthesia Transfer of Care Note  Patient: Todd Hunt  Procedure(s) Performed: Francia Greaves UNDER ANESTHESIA (Rectum) IRRIGATION AND DEBRIDEMENT PERIANAL ABSCESS (Rectum)  Patient Location: PACU  Anesthesia Type:General  Level of Consciousness: awake, alert , oriented, and patient cooperative  Airway & Oxygen Therapy: Patient Spontanous Breathing and Patient connected to face mask oxygen  Post-op Assessment: Report given to RN and Post -op Vital signs reviewed and stable  Post vital signs: Reviewed and stable  Last Vitals:  Vitals Value Taken Time  BP 123/82 08/31/22 1209  Temp    Pulse 98 08/31/22 1211  Resp 19 08/31/22 1211  SpO2 95 % 08/31/22 1211  Vitals shown include unfiled device data.  Last Pain:  Vitals:   08/31/22 0933  TempSrc: Oral  PainSc: 4       Patients Stated Pain Goal: 5 (08/30/22 2125)  Complications: No notable events documented.

## 2022-08-31 NOTE — Interval H&P Note (Signed)
History and Physical Interval Note:  08/31/2022 10:50 AM  Todd Hunt  has presented today for surgery, with the diagnosis of perianal abscess.  The various methods of treatment have been discussed with the patient and family. After consideration of risks, benefits and other options for treatment, the patient has consented to  Procedure(s): EXAM UNDER ANESTHESIA , IRRIGATION AND DEBRIDEMENT PERIANAL ABSCESS (N/A) as a surgical intervention.  The patient's history has been reviewed, patient examined, no change in status, stable for surgery.  I have reviewed the patient's chart and labs.  Questions were answered to the patient's satisfaction.     Makhya Arave A Mamie Diiorio

## 2022-08-31 NOTE — Anesthesia Preprocedure Evaluation (Signed)
Anesthesia Evaluation  Patient identified by MRN, date of birth, ID band Patient awake    Reviewed: Allergy & Precautions, NPO status , Patient's Chart, lab work & pertinent test results  Airway Mallampati: II  TM Distance: >3 FB Neck ROM: Full    Dental  (+) Dental Advisory Given   Pulmonary Current Smoker   breath sounds clear to auscultation       Cardiovascular hypertension, Pt. on medications  Rhythm:Regular Rate:Normal     Neuro/Psych negative neurological ROS     GI/Hepatic negative GI ROS, Neg liver ROS,,,  Endo/Other  negative endocrine ROS    Renal/GU negative Renal ROS     Musculoskeletal   Abdominal   Peds  Hematology negative hematology ROS (+)   Anesthesia Other Findings   Reproductive/Obstetrics                             Anesthesia Physical Anesthesia Plan  ASA: 2  Anesthesia Plan: General   Post-op Pain Management: Tylenol PO (pre-op)* and Toradol IV (intra-op)*   Induction: Intravenous  PONV Risk Score and Plan: 1 and Dexamethasone, Midazolam and Treatment may vary due to age or medical condition  Airway Management Planned: Oral ETT and LMA  Additional Equipment:   Intra-op Plan:   Post-operative Plan: Extubation in OR  Informed Consent: I have reviewed the patients History and Physical, chart, labs and discussed the procedure including the risks, benefits and alternatives for the proposed anesthesia with the patient or authorized representative who has indicated his/her understanding and acceptance.       Plan Discussed with: CRNA  Anesthesia Plan Comments:         Anesthesia Quick Evaluation

## 2022-09-01 ENCOUNTER — Encounter (HOSPITAL_COMMUNITY): Payer: Self-pay | Admitting: Surgery
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
## 2023-06-07 ENCOUNTER — Emergency Department (HOSPITAL_COMMUNITY)
Admission: EM | Admit: 2023-06-07 | Discharge: 2023-06-08 | Disposition: A | Discharge status: Home / Self Care | Attending: Emergency Medicine | Admitting: Emergency Medicine

## 2023-06-07 ENCOUNTER — Encounter (HOSPITAL_COMMUNITY): Payer: Self-pay | Admitting: *Deleted

## 2023-06-07 ENCOUNTER — Other Ambulatory Visit: Payer: Self-pay

## 2023-06-07 ENCOUNTER — Emergency Department (HOSPITAL_COMMUNITY)

## 2023-06-07 DIAGNOSIS — I10 Essential (primary) hypertension: Secondary | ICD-10-CM | POA: Insufficient documentation

## 2023-06-07 DIAGNOSIS — Z79899 Other long term (current) drug therapy: Secondary | ICD-10-CM | POA: Diagnosis not present

## 2023-06-07 DIAGNOSIS — N492 Inflammatory disorders of scrotum: Secondary | ICD-10-CM | POA: Diagnosis present

## 2023-06-07 LAB — BASIC METABOLIC PANEL WITH GFR
Anion gap: 11 (ref 5–15)
BUN: 10 mg/dL (ref 6–20)
CO2: 24 mmol/L (ref 22–32)
Calcium: 8.7 mg/dL — ABNORMAL LOW (ref 8.9–10.3)
Chloride: 100 mmol/L (ref 98–111)
Creatinine, Ser: 1.09 mg/dL (ref 0.61–1.24)
GFR, Estimated: >60 mL/min (ref >60)
Glucose, Bld: 132 mg/dL — ABNORMAL HIGH (ref 70–99)
Potassium: 4 mmol/L (ref 3.5–5.1)
Sodium: 135 mmol/L (ref 135–145)

## 2023-06-07 LAB — I-STAT CG4 LACTIC ACID, ED: Lactic Acid, Venous: 1.2 mmol/L (ref 0.5–1.9)

## 2023-06-07 LAB — CBC
HCT: 44.1 % (ref 39.0–52.0)
Hemoglobin: 14.8 g/dL (ref 13.0–17.0)
MCH: 34.8 pg — ABNORMAL HIGH (ref 26.0–34.0)
MCHC: 33.6 g/dL (ref 30.0–36.0)
MCV: 103.8 fL — ABNORMAL HIGH (ref 80.0–100.0)
Platelets: 280 10*3/uL (ref 150–400)
RBC: 4.25 MIL/uL (ref 4.22–5.81)
RDW: 13.3 % (ref 11.5–15.5)
WBC: 7.3 10*3/uL (ref 4.0–10.5)
nRBC: 0 % (ref 0.0–0.2)

## 2023-06-07 MED ORDER — SULFAMETHOXAZOLE-TRIMETHOPRIM 800-160 MG PO TABS
1.0000 | ORAL_TABLET | Freq: Once | ORAL | Status: AC
  Administered 2023-06-08: 1 via ORAL
  Filled 2023-06-07: qty 1

## 2023-06-07 MED ORDER — LIDOCAINE HCL (PF) 1 % IJ SOLN
5.0000 mL | Freq: Once | INTRAMUSCULAR | Status: AC
  Administered 2023-06-08: 5 mL
  Filled 2023-06-07: qty 5

## 2023-06-07 MED ORDER — SULFAMETHOXAZOLE-TRIMETHOPRIM 800-160 MG PO TABS: 1.0000 | ORAL_TABLET | Freq: Two times a day (BID) | ORAL | 0 refills | Status: AC

## 2023-06-07 NOTE — ED Notes (Signed)
 Patient transported to Ultrasound

## 2023-06-07 NOTE — ED Notes (Signed)
 Pt. Walking around due to not being able to sit down comfortably.  Pt. Is having increased pain.

## 2023-06-07 NOTE — ED Provider Notes (Incomplete)
 [place] [facility] Provider Note   CSN: [id number] Arrival date & time: 06/07/23  7829     History {Add pertinent medical, surgical, social history, OB history to HPI:1} Chief Complaint  Patient presents with  . Recurrent Skin Infections    Todd Hunt is a 48 y.o. male with hypertension presents with concern for abscess involving his scrotum.  States he first noticed this a couple weeks ago.  Notes that it recently started draining.  Denies any fevers or chills. Patient underwent a I&D for perirectal abscess on 7/11.  He was admitted and started on antibiotics at that time.   HPI     Home Medications Prior to Admission medications   Medication Sig Start Date End Date Taking? Authorizing Provider  sulfamethoxazole-trimethoprim (BACTRIM DS) 800-160 MG tablet Take 1 tablet by mouth 2 (two) times daily for 7 days. 06/07/23 06/14/23 Yes Felicie Horning, PA-C  acetaminophen (TYLENOL) 500 MG tablet Take 2 tablets (1,000 mg total) by mouth every 6 (six) hours as needed for mild pain, moderate pain, headache or fever. 08/31/22   Marlin Simmonds, PA-C  ibuprofen (MOTRIN IB) 200 MG tablet Take 3 tablets (600 mg total) by mouth every 8 (eight) hours as needed. 08/31/22 08/31/23  Marlin Simmonds, PA-C  naltrexone (DEPADE) 50 MG tablet Take 50 mg by mouth every evening.    [provider]  rosuvastatin (CRESTOR) 5 MG tablet Take 5 mg by mouth daily. When compliant 04/26/22   [provider]  valsartan (DIOVAN) 160 MG tablet Take 160 mg by mouth daily.    [provider]      Allergies    Patient has no known allergies.    Review of Systems   Review of Systems  Skin:  Positive for wound.    Physical Exam Updated Vital Signs BP 125/89   Pulse 96   Temp (!) 97.4 F (36.3 C) (Oral)   Resp 15   Ht 5\' 9"  (1.753 m)   Wt 63 kg   SpO2 97%   BMI 20.51 kg/m  Physical Exam Vitals and nursing note reviewed.  Constitutional:       General: He is not in acute distress.    Appearance: He is well-developed.  HENT:     Head: Normocephalic and atraumatic.  Eyes:     Conjunctiva/sclera: Conjunctivae normal.  Cardiovascular:     Rate and Rhythm: Normal rate and regular rhythm.     Heart sounds: No murmur heard. Pulmonary:     Effort: Pulmonary effort is normal. No respiratory distress.     Breath sounds: Normal breath sounds.  Abdominal:     Palpations: Abdomen is soft.     Tenderness: There is no abdominal tenderness.  Genitourinary:    Comments: 3 cm erythematous area of fluctuance on the right side of the scrotum with foul-smelling purulent drainage, tender to palpation, no significant warmth.  Musculoskeletal:        General: No swelling.     Cervical back: Neck supple.  Skin:    General: Skin is warm and dry.     Capillary Refill: Capillary refill takes less than 2 seconds.  Neurological:     Mental Status: He is alert.  Psychiatric:        Mood and Affect: Mood normal.     ED Results / Procedures / Treatments   Labs (all labs ordered are listed, but only abnormal results are displayed) Labs Reviewed  CBC - Abnormal;  Notable for the following components:      Result Value   MCV 103.8 (*)    MCH 34.8 (*)    All other components within normal limits  BASIC METABOLIC PANEL WITH GFR - Abnormal; Notable for the following components:   Glucose, Bld 132 (*)    Calcium 8.7 (*)    All other components within normal limits  CULTURE, BLOOD (ROUTINE X 2)  CULTURE, BLOOD (ROUTINE X 2)  I-STAT CG4 LACTIC ACID, ED    EKG None  Radiology US Scrotum Result Date: 06/07/2023 CLINICAL DATA:  Abscess draining from the right testicle EXAM: SCROTAL ULTRASOUND TECHNIQUE: Complete ultrasound examination of the testicles, epididymis, and other scrotal structures was performed. Color and spectral Doppler ultrasound were also utilized to evaluate blood flow to the testicles. COMPARISON:  None Available. FINDINGS:  Right testicle Measurements: 4.3 x 2.3 x 3 cm. No mass or microlithiasis visualized. In the area of pain along the inferior scrotal sac, there is heterogeneity of the subcutaneous tissues without well-formed abscess or drainable fluid collection visualized. No dirty shadowing noted to suggest subcutaneous gas. Left testicle Measurements: 5.2 x 2.6 x 3.2 cm. No mass or microlithiasis visualized. Tubular ectasia of the rete testis. Right epididymis:  Normal in size and appearance. Left epididymis:  Small epididymal head cyst measuring 8 mm. Hydrocele:  Trace bilateral hydroceles. Varicocele:  None visualized. IMPRESSION: 1. In the area of pain along the inferior right scrotal sac, there is heterogeneity of the subcutaneous tissues without well-formed abscess or drainable fluid collection visualized. No dirty shadowing noted to suggest subcutaneous gas. This may reflect changes of scrotal cellulitis possibly with a forming phlegmon. 2. No testicular mass or findings of epididymo-orchitis. 3. Trace bilateral hydroceles. Electronically Signed   By: Wallie Char M.D.   On: 06/07/2023 20:03    Procedures Procedures  {Document cardiac monitor, telemetry assessment procedure when appropriate:1}  Medications Ordered in ED Medications  lidocaine (PF) (XYLOCAINE) 1 % injection 5 mL (has no administration in time range)  sulfamethoxazole-trimethoprim (BACTRIM DS) 800-160 MG per tablet 1 tablet (has no administration in time range)    ED Course/ Medical Decision Making/ A&P   {   Click here for ABCD2, HEART and other calculatorsREFRESH Note before signing :1}                              Medical Decision Making Amount and/or Complexity of Data Reviewed Labs: ordered. Radiology: ordered.   This patient presents to the ED with chief complaint(s) of abscess.  The complaint involves an extensive differential diagnosis and also carries with it a high risk of complications and morbidity.   pertinent past  medical history as listed in HPI  The differential diagnosis includes  Fournier's gangrene, necrotizing fasciitis, cellulitis, abscess  Additional history obtained: Records reviewed previous admission documents and Care Everywhere/External Records  Initial Assessment:   Patient presents hypertensive 151/112 with complaints of concern for painful " cyst" on his scrotum that he first noticed a few weeks ago.  Reports it is growing and started draining.  Patient is afebrile and overall well-appearing.  On exam there is a 3 cm diameter area of fluctuance on the right side of the scrotum with purulent foul-smelling drainage.  It is tender to palpation.  Mildly erythematous.  No significant warmth.  Independent ECG interpretation:  none  Independent labs interpretation:  The following labs were independently interpreted:  CBC and BMP without  significant abnormality, lactic within normal limits  Independent visualization and interpretation of imaging: I independently visualized the following imaging with scope of interpretation limited to determining acute life threatening conditions related to emergency care: Scrotal ultrasound, which revealed  1. In the area of pain along the inferior right scrotal sac, there  is heterogeneity of the subcutaneous tissues without well-formed  abscess or drainable fluid collection visualized. No dirty shadowing  noted to suggest subcutaneous gas. This may reflect changes of  scrotal cellulitis possibly with a forming phlegmon.  2. No testicular mass or findings of epididymo-orchitis.  3. Trace bilateral hydroceles.    Treatment and Reassessment: I&D performed  Consultations obtained:   Urology Dr. Eskridge-recommended I&D, wound culture, bactrim, f/u outpatient  Disposition:   Patient be discharged home. The patient has been appropriately medically screened and/or stabilized in the ED. I have low suspicion for any other emergent medical condition which  would require further screening, evaluation or treatment in the ED or require inpatient management. At time of discharge the patient is hemodynamically stable and in no acute distress. I have discussed work-up results and diagnosis with patient and answered all questions. Patient is agreeable with discharge plan. We discussed strict return precautions for returning to the emergency department and they verbalized understanding.     Social Determinants of Health:   none  This note was dictated with voice recognition software.  Despite best efforts at proofreading, errors may have occurred which can change the documentation meaning.    {Document critical care time when appropriate:1} {Document review of labs and clinical decision tools ie heart score, Chads2Vasc2 etc:1}  {Document your independent review of radiology images, and any outside records:1} {Document your discussion with family members, caretakers, and with consultants:1} {Document social determinants of health affecting pt's care:1} {Document your decision making why or why not admission, treatments were needed:1} Final Clinical Impression(s) / ED Diagnoses Final diagnoses:  Scrotal abscess    Rx / DC Orders ED Discharge Orders          Ordered    sulfamethoxazole-trimethoprim (BACTRIM DS) 800-160 MG tablet  2 times daily        06/07/23 2310

## 2023-06-07 NOTE — ED Provider Notes (Signed)
 [place] EMERGENCY DEPARTMENT AT [facility] Provider Note   CSN: [id number] Arrival date & time: 06/07/23  1191     History  Chief Complaint  Patient presents with   Recurrent Skin Infections    Todd Hunt is a 48 y.o. male with hypertension presents with concern for abscess involving his scrotum.  States he first noticed this a couple weeks ago.  Notes that it recently started draining.  Denies any fevers or chills. Patient underwent a I&D for perirectal abscess on 7/11.  He was admitted and started on antibiotics at that time.   HPI     Home Medications Prior to Admission medications   Medication Sig Start Date End Date Taking? Authorizing Provider  sulfamethoxazole -trimethoprim  (BACTRIM  DS) 800-160 MG tablet Take 1 tablet by mouth 2 (two) times daily for 7 days. 06/07/23 06/14/23 Yes Felicie Horning, PA-C  acetaminophen  (TYLENOL ) 500 MG tablet Take 2 tablets (1,000 mg total) by mouth every 6 (six) hours as needed for mild pain, moderate pain, headache or fever. 08/31/22   Marlin Simmonds, PA-C  ibuprofen  (MOTRIN  IB) 200 MG tablet Take 3 tablets (600 mg total) by mouth every 8 (eight) hours as needed. 08/31/22 08/31/23  Marlin Simmonds, PA-C  naltrexone (DEPADE) 50 MG tablet Take 50 mg by mouth every evening.    [provider]  rosuvastatin (CRESTOR) 5 MG tablet Take 5 mg by mouth daily. When compliant 04/26/22   [provider]  valsartan (DIOVAN) 160 MG tablet Take 160 mg by mouth daily.    [provider]      Allergies    Patient has no known allergies.    Review of Systems   Review of Systems  Skin:  Positive for wound.    Physical Exam Updated Vital Signs BP 125/89   Pulse 96   Temp (!) 97.4 F (36.3 C) (Oral)   Resp 15   Ht 5\' 9"  (1.753 m)   Wt 63 kg   SpO2 97%   BMI 20.51 kg/m  Physical Exam Vitals and nursing note reviewed.  Constitutional:      General: He is not in acute distress.    Appearance: He is  well-developed.  HENT:     Head: Normocephalic and atraumatic.  Eyes:     Conjunctiva/sclera: Conjunctivae normal.  Cardiovascular:     Rate and Rhythm: Normal rate and regular rhythm.     Heart sounds: No murmur heard. Pulmonary:     Effort: Pulmonary effort is normal. No respiratory distress.     Breath sounds: Normal breath sounds.  Abdominal:     Palpations: Abdomen is soft.     Tenderness: There is no abdominal tenderness.  Genitourinary:    Comments: 3 cm erythematous area of fluctuance on the right side of the scrotum with foul-smelling purulent drainage, tender to palpation, no significant warmth.  Musculoskeletal:        General: No swelling.     Cervical back: Neck supple.  Skin:    General: Skin is warm and dry.     Capillary Refill: Capillary refill takes less than 2 seconds.  Neurological:     Mental Status: He is alert.  Psychiatric:        Mood and Affect: Mood normal.     ED Results / Procedures / Treatments   Labs (all labs ordered are listed, but only abnormal results are displayed) Labs Reviewed  CBC - Abnormal; Notable for the following components:  Result Value   MCV 103.8 (*)    MCH 34.8 (*)    All other components within normal limits  BASIC METABOLIC PANEL WITH GFR - Abnormal; Notable for the following components:   Glucose, Bld 132 (*)    Calcium 8.7 (*)    All other components within normal limits  CULTURE, BLOOD (ROUTINE X 2)  CULTURE, BLOOD (ROUTINE X 2)  I-STAT CG4 LACTIC ACID, ED    EKG None  Radiology US  Scrotum Result Date: 06/07/2023 CLINICAL DATA:  Abscess draining from the right testicle EXAM: SCROTAL ULTRASOUND TECHNIQUE: Complete ultrasound examination of the testicles, epididymis, and other scrotal structures was performed. Color and spectral Doppler ultrasound were also utilized to evaluate blood flow to the testicles. COMPARISON:  None Available. FINDINGS: Right testicle Measurements: 4.3 x 2.3 x 3 cm. No mass or  microlithiasis visualized. In the area of pain along the inferior scrotal sac, there is heterogeneity of the subcutaneous tissues without well-formed abscess or drainable fluid collection visualized. No dirty shadowing noted to suggest subcutaneous gas. Left testicle Measurements: 5.2 x 2.6 x 3.2 cm. No mass or microlithiasis visualized. Tubular ectasia of the rete testis. Right epididymis:  Normal in size and appearance. Left epididymis:  Small epididymal head cyst measuring 8 mm. Hydrocele:  Trace bilateral hydroceles. Varicocele:  None visualized. IMPRESSION: 1. In the area of pain along the inferior right scrotal sac, there is heterogeneity of the subcutaneous tissues without well-formed abscess or drainable fluid collection visualized. No dirty shadowing noted to suggest subcutaneous gas. This may reflect changes of scrotal cellulitis possibly with a forming phlegmon. 2. No testicular mass or findings of epididymo-orchitis. 3. Trace bilateral hydroceles. Electronically Signed   By: Rance Burrows M.D.   On: 06/07/2023 20:03    Procedures .Incision and Drainage  Date/Time: 06/08/2023 12:13 AM  Performed by: Felicie Horning, PA-C Authorized by: Felicie Horning, PA-C   Consent:    Consent obtained:  Verbal   Consent given by:  Patient   Risks discussed:  Incomplete drainage and pain   Alternatives discussed:  No treatment Universal protocol:    Procedure explained and questions answered to patient or proxy's satisfaction: yes     Patient identity confirmed:  Verbally with patient and arm band Location:    Type:  Abscess   Location:  Anogenital   Anogenital location:  Scrotal wall Pre-procedure details:    Skin preparation:  Chlorhexidine  with alcohol Anesthesia:    Anesthesia method:  Local infiltration   Local anesthetic:  Lidocaine  1% w/o epi Procedure details:    Incision types:  Single straight   Wound management:  Probed and deloculated   Drainage:  Purulent and  serosanguinous   Drainage amount:  Copious   Wound treatment:  Wound left open   Packing materials:  None Post-procedure details:    Procedure completion:  Tolerated     Medications Ordered in ED Medications  lidocaine  (PF) (XYLOCAINE ) 1 % injection 5 mL (has no administration in time range)  sulfamethoxazole -trimethoprim  (BACTRIM  DS) 800-160 MG per tablet 1 tablet (has no administration in time range)    ED Course/ Medical Decision Making/ A&P                                 Medical Decision Making Amount and/or Complexity of Data Reviewed Labs: ordered. Radiology: ordered.  Risk Prescription drug management.   This patient presents to the ED with  chief complaint(s) of abscess.  The complaint involves an extensive differential diagnosis and also carries with it a high risk of complications and morbidity.   pertinent past medical history as listed in HPI  The differential diagnosis includes  Fournier's gangrene, necrotizing fasciitis, cellulitis, abscess  Additional history obtained: Records reviewed previous admission documents and Care Everywhere/External Records  Initial Assessment:   Patient presents hypertensive 151/112 with complaints of concern for painful " cyst" on his scrotum that he first noticed a few weeks ago.  Reports it is growing and started draining.  Patient is afebrile and overall well-appearing.  On exam there is a 3 cm diameter area of fluctuance on the right side of the scrotum with purulent foul-smelling drainage.  It is tender to palpation.  Mildly erythematous.  No significant warmth.  Independent ECG interpretation:  none  Independent labs interpretation:  The following labs were independently interpreted:  CBC and BMP without significant abnormality, lactic within normal limits  Independent visualization and interpretation of imaging: I independently visualized the following imaging with scope of interpretation limited to determining acute  life threatening conditions related to emergency care: Scrotal ultrasound, which revealed  1. In the area of pain along the inferior right scrotal sac, there  is heterogeneity of the subcutaneous tissues without well-formed  abscess or drainable fluid collection visualized. No dirty shadowing  noted to suggest subcutaneous gas. This may reflect changes of  scrotal cellulitis possibly with a forming phlegmon.  2. No testicular mass or findings of epididymo-orchitis.  3. Trace bilateral hydroceles.    Treatment and Reassessment: I&D performed, wound left open, not large enough to benefit from packing, culture obtained, given first pill of Bactrim  here will discharge home.  Consultations obtained:   Urology Dr. Eskridge-recommended I&D, wound culture, bactrim , f/u outpatient  Disposition:   Patient be discharged home. The patient has been appropriately medically screened and/or stabilized in the ED. I have low suspicion for any other emergent medical condition which would require further screening, evaluation or treatment in the ED or require inpatient management. At time of discharge the patient is hemodynamically stable and in no acute distress. I have discussed work-up results and diagnosis with patient and answered all questions. Patient is agreeable with discharge plan. We discussed strict return precautions for returning to the emergency department and they verbalized understanding.     Social Determinants of Health:   none  This note was dictated with voice recognition software.  Despite best efforts at proofreading, errors may have occurred which can change the documentation meaning.          Final Clinical Impression(s) / ED Diagnoses Final diagnoses:  Scrotal abscess    Rx / DC Orders ED Discharge Orders          Ordered    sulfamethoxazole -trimethoprim  (BACTRIM  DS) 800-160 MG tablet  2 times daily        06/07/23 2310              Felicie Horning,  PA-C 06/08/23 0016    Sallyanne Creamer, DO 06/13/23 0045

## 2023-06-07 NOTE — ED Notes (Signed)
 Pt. Came up to the desk and informed that he was going to go to the store and pick up a snack.  Pt given a drink and lunch bag.  Encouraged pt to stay and not to leave.

## 2023-06-07 NOTE — ED Triage Notes (Signed)
 Pt is here for evaluation of a painful and draining area on his right scrotum, pt is calling this a cyst and states that it has been getting larger and more painful since it first appeared in early April.  No fever or chills, has been draining since this am.

## 2023-06-07 NOTE — ED Notes (Signed)
 Pt. Get up and goes outside.  Gait steady.

## 2023-06-07 NOTE — Discharge Instructions (Addendum)
 You were evaluated in the emergency room for concern for abscess.  Prescription for antibiotics was sent to your pharmacy.  Please be sure to complete the full course of antibiotics.

## 2023-06-11 LAB — AEROBIC/ANAEROBIC CULTURE W GRAM STAIN (SURGICAL/DEEP WOUND)

## 2023-06-12 ENCOUNTER — Telehealth (HOSPITAL_BASED_OUTPATIENT_CLINIC_OR_DEPARTMENT_OTHER): Payer: Self-pay

## 2023-06-12 LAB — CULTURE, BLOOD (ROUTINE X 2)
Culture: NO GROWTH
Culture: NO GROWTH
Special Requests: ADEQUATE
Special Requests: ADEQUATE

## 2023-06-12 NOTE — Telephone Encounter (Signed)
 Post ED Visit - Positive Culture Follow-up  Culture report reviewed by antimicrobial stewardship pharmacist: [facility] [x]  Sheryle Donning, [place].D. []  Skeet Duke, Pharm.D., BCPS AQ-ID []  Leslee Rase, Pharm.D., BCPS []  Garland Junk, Pharm.D., BCPS []  [place], [address].D., BCPS, AAHIVP []  Alcide Aly, Pharm.D., BCPS, AAHIVP []  Jerri Morale, PharmD, BCPS []  Graham Laws, PharmD, BCPS []  Cleda Curly, PharmD, BCPS []  Tamar Fairly, PharmD []  Ballard Levels, PharmD, BCPS []  Ollen Beverage, PharmD  [facility] []  Arlyne Bering, PharmD []  Sherryle Don, PharmD []  Van Gelinas, PharmD []  Delila Felty, Rph []  [place]) Cleora Daft, PharmD []  Augustina Block, PharmD []  Arie Kurtz, PharmD []  Sharlyn Deaner, PharmD []  Agnes Hose, PharmD []  Kendall Pauls, PharmD []  Gladstone Lamer, PharmD []  Armanda Bern, PharmD []  Tera Fellows, PharmD   Positive wound culture Treated with Sulfamethoxazole -Trimethoprim , organism sensitive to the same and no further patient follow-up is required at this time.  Delena Feil 06/12/2023, 7:50 AM

## 2024-03-16 ENCOUNTER — Inpatient Hospital Stay (HOSPITAL_COMMUNITY)
Admission: EM | Admit: 2024-03-16 | Discharge: 2024-03-21 | DRG: 184 | Disposition: A | Discharge status: Home / Self Care | Payer: Self-pay

## 2024-03-16 ENCOUNTER — Emergency Department (HOSPITAL_COMMUNITY): Payer: Self-pay

## 2024-03-16 ENCOUNTER — Other Ambulatory Visit: Payer: Self-pay

## 2024-03-16 ENCOUNTER — Encounter (HOSPITAL_COMMUNITY): Payer: Self-pay

## 2024-03-16 DIAGNOSIS — F10130 Alcohol abuse with withdrawal, uncomplicated: Secondary | ICD-10-CM | POA: Diagnosis present

## 2024-03-16 DIAGNOSIS — S42102A Fracture of unspecified part of scapula, left shoulder, initial encounter for closed fracture: Secondary | ICD-10-CM | POA: Diagnosis present

## 2024-03-16 DIAGNOSIS — R911 Solitary pulmonary nodule: Secondary | ICD-10-CM | POA: Diagnosis present

## 2024-03-16 DIAGNOSIS — E66812 Obesity, class 2: Secondary | ICD-10-CM | POA: Diagnosis present

## 2024-03-16 DIAGNOSIS — S42192A Fracture of other part of scapula, left shoulder, initial encounter for closed fracture: Secondary | ICD-10-CM

## 2024-03-16 DIAGNOSIS — S27321A Contusion of lung, unilateral, initial encounter: Secondary | ICD-10-CM | POA: Diagnosis present

## 2024-03-16 DIAGNOSIS — S42025A Nondisplaced fracture of shaft of left clavicle, initial encounter for closed fracture: Secondary | ICD-10-CM | POA: Diagnosis present

## 2024-03-16 DIAGNOSIS — Z6835 Body mass index (BMI) 35.0-35.9, adult: Secondary | ICD-10-CM

## 2024-03-16 DIAGNOSIS — S2249XA Multiple fractures of ribs, unspecified side, initial encounter for closed fracture: Secondary | ICD-10-CM | POA: Diagnosis present

## 2024-03-16 DIAGNOSIS — I10 Essential (primary) hypertension: Secondary | ICD-10-CM | POA: Diagnosis present

## 2024-03-16 DIAGNOSIS — F17210 Nicotine dependence, cigarettes, uncomplicated: Secondary | ICD-10-CM | POA: Diagnosis present

## 2024-03-16 DIAGNOSIS — S2242XA Multiple fractures of ribs, left side, initial encounter for closed fracture: Principal | ICD-10-CM | POA: Diagnosis present

## 2024-03-16 DIAGNOSIS — Z79899 Other long term (current) drug therapy: Secondary | ICD-10-CM

## 2024-03-16 DIAGNOSIS — R0902 Hypoxemia: Secondary | ICD-10-CM | POA: Diagnosis present

## 2024-03-16 DIAGNOSIS — N179 Acute kidney failure, unspecified: Secondary | ICD-10-CM | POA: Diagnosis present

## 2024-03-16 LAB — CBC WITH DIFFERENTIAL/PLATELET
Abs Immature Granulocytes: 0.04 10*3/uL (ref 0.00–0.07)
Basophils Absolute: 0 10*3/uL (ref 0.0–0.1)
Basophils Relative: 0 %
Eosinophils Absolute: 0 10*3/uL (ref 0.0–0.5)
Eosinophils Relative: 0 %
HCT: 43.3 % (ref 39.0–52.0)
Hemoglobin: 14.6 g/dL (ref 13.0–17.0)
Immature Granulocytes: 0 %
Lymphocytes Relative: 14 %
Lymphs Abs: 1.3 10*3/uL (ref 0.7–4.0)
MCH: 34.4 pg — ABNORMAL HIGH (ref 26.0–34.0)
MCHC: 33.7 g/dL (ref 30.0–36.0)
MCV: 101.9 fL — ABNORMAL HIGH (ref 80.0–100.0)
Monocytes Absolute: 0.7 10*3/uL (ref 0.1–1.0)
Monocytes Relative: 7 %
Neutro Abs: 7.3 10*3/uL (ref 1.7–7.7)
Neutrophils Relative %: 79 %
Platelets: 134 10*3/uL — ABNORMAL LOW (ref 150–400)
RBC: 4.25 MIL/uL (ref 4.22–5.81)
RDW: 15.5 % (ref 11.5–15.5)
Smear Review: NORMAL
WBC: 9.3 10*3/uL (ref 4.0–10.5)
nRBC: 0 % (ref 0.0–0.2)

## 2024-03-16 LAB — COMPREHENSIVE METABOLIC PANEL WITH GFR
ALT: 35 U/L (ref 0–44)
AST: 53 U/L — ABNORMAL HIGH (ref 15–41)
Albumin: 4 g/dL (ref 3.5–5.0)
Alkaline Phosphatase: 97 U/L (ref 38–126)
Anion gap: 20 — ABNORMAL HIGH (ref 5–15)
BUN: 16 mg/dL (ref 6–20)
CO2: 16 mmol/L — ABNORMAL LOW (ref 22–32)
Calcium: 9.3 mg/dL (ref 8.9–10.3)
Chloride: 100 mmol/L (ref 98–111)
Creatinine, Ser: 1.35 mg/dL — ABNORMAL HIGH (ref 0.61–1.24)
GFR, Estimated: >60 mL/min
Glucose, Bld: 111 mg/dL — ABNORMAL HIGH (ref 70–99)
Potassium: 4.2 mmol/L (ref 3.5–5.1)
Sodium: 136 mmol/L (ref 135–145)
Total Bilirubin: 0.4 mg/dL (ref 0.0–1.2)
Total Protein: 7.6 g/dL (ref 6.5–8.1)

## 2024-03-16 MED ORDER — SODIUM CHLORIDE 0.9 % IV BOLUS
1000.0000 mL | Freq: Once | INTRAVENOUS | Status: AC
  Administered 2024-03-16: 1000 mL via INTRAVENOUS

## 2024-03-16 MED ORDER — MORPHINE SULFATE (PF) 4 MG/ML IV SOLN
4.0000 mg | Freq: Once | INTRAVENOUS | Status: AC
  Administered 2024-03-16: 4 mg via INTRAVENOUS
  Filled 2024-03-16: qty 1

## 2024-03-16 MED ORDER — OXYCODONE-ACETAMINOPHEN 5-325 MG PO TABS
1.0000 | ORAL_TABLET | Freq: Once | ORAL | Status: AC
  Administered 2024-03-16: 1 via ORAL
  Filled 2024-03-16: qty 1

## 2024-03-16 MED ORDER — IOHEXOL 350 MG/ML SOLN
75.0000 mL | Freq: Once | INTRAVENOUS | Status: AC | PRN
  Administered 2024-03-16: 75 mL via INTRAVENOUS

## 2024-03-16 MED ORDER — FENTANYL CITRATE (PF) 50 MCG/ML IJ SOSY
50.0000 ug | PREFILLED_SYRINGE | Freq: Once | INTRAMUSCULAR | Status: AC
  Administered 2024-03-16: 50 ug via INTRAVENOUS
  Filled 2024-03-16: qty 1
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
## 2024-03-16 NOTE — ED Notes (Signed)
 Attempted to get labs, not successful

## 2024-03-16 NOTE — Progress Notes (Signed)
 Orthopedic Tech Progress Note Patient Details:  Todd Hunt 05/11/1975 [id number]  Ortho Devices Type of Ortho Device: Sling immobilizer Ortho Device/Splint Location: lue Ortho Device/Splint Interventions: Ordered, Application, Adjustment   Post Interventions Patient Tolerated: Well Instructions Provided: Care of device, Adjustment of device  [name] PARAS 03/16/2024, 8:37 PM
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
## 2024-03-17 ENCOUNTER — Inpatient Hospital Stay (HOSPITAL_COMMUNITY): Payer: Self-pay

## 2024-03-17 DIAGNOSIS — S2249XA Multiple fractures of ribs, unspecified side, initial encounter for closed fracture: Secondary | ICD-10-CM | POA: Diagnosis present

## 2024-03-17 LAB — BASIC METABOLIC PANEL WITH GFR
Anion gap: 13 (ref 5–15)
BUN: 18 mg/dL (ref 6–20)
CO2: 23 mmol/L (ref 22–32)
Calcium: 8.7 mg/dL — ABNORMAL LOW (ref 8.9–10.3)
Chloride: 100 mmol/L (ref 98–111)
Creatinine, Ser: 1.33 mg/dL — ABNORMAL HIGH (ref 0.61–1.24)
GFR, Estimated: >60 mL/min
Glucose, Bld: 117 mg/dL — ABNORMAL HIGH (ref 70–99)
Potassium: 4 mmol/L (ref 3.5–5.1)
Sodium: 136 mmol/L (ref 135–145)

## 2024-03-17 LAB — CBC
HCT: 40.2 % (ref 39.0–52.0)
Hemoglobin: 13.4 g/dL (ref 13.0–17.0)
MCH: 34 pg (ref 26.0–34.0)
MCHC: 33.3 g/dL (ref 30.0–36.0)
MCV: 102 fL — ABNORMAL HIGH (ref 80.0–100.0)
Platelets: 191 10*3/uL (ref 150–400)
RBC: 3.94 MIL/uL — ABNORMAL LOW (ref 4.22–5.81)
RDW: 15.5 % (ref 11.5–15.5)
WBC: 8.3 10*3/uL (ref 4.0–10.5)
nRBC: 0 % (ref 0.0–0.2)

## 2024-03-17 LAB — HIV ANTIBODY (ROUTINE TESTING W REFLEX): HIV Screen 4th Generation wRfx: NONREACTIVE

## 2024-03-17 MED ORDER — METOPROLOL TARTRATE 12.5 MG HALF TABLET
12.5000 mg | ORAL_TABLET | Freq: Two times a day (BID) | ORAL | Status: DC
  Administered 2024-03-17 – 2024-03-21 (×8): 12.5 mg via ORAL
  Filled 2024-03-17 (×8): qty 1

## 2024-03-17 MED ORDER — PHENOBARBITAL 32.4 MG PO TABS
97.2000 mg | ORAL_TABLET | Freq: Three times a day (TID) | ORAL | Status: AC
  Administered 2024-03-17 – 2024-03-18 (×6): 97.2 mg via ORAL
  Filled 2024-03-17 (×6): qty 3

## 2024-03-17 MED ORDER — POLYETHYLENE GLYCOL 3350 17 G PO PACK: 17.0000 g | PACK | Freq: Every day | ORAL | Status: DC | PRN

## 2024-03-17 MED ORDER — LACTATED RINGERS IV BOLUS
500.0000 mL | Freq: Once | INTRAVENOUS | Status: AC
  Administered 2024-03-17: 500 mL via INTRAVENOUS

## 2024-03-17 MED ORDER — METHOCARBAMOL 500 MG PO TABS
1000.0000 mg | ORAL_TABLET | Freq: Three times a day (TID) | ORAL | Status: DC
  Administered 2024-03-17 (×2): 1000 mg via ORAL
  Filled 2024-03-17 (×2): qty 2

## 2024-03-17 MED ORDER — METOPROLOL TARTRATE 5 MG/5ML IV SOLN: 5.0000 mg | Freq: Four times a day (QID) | INTRAVENOUS | Status: DC | PRN

## 2024-03-17 MED ORDER — GABAPENTIN 300 MG PO CAPS: 300.0000 mg | ORAL_CAPSULE | Freq: Three times a day (TID) | ORAL | Status: DC

## 2024-03-17 MED ORDER — SENNA 8.6 MG PO TABS
1.0000 | ORAL_TABLET | Freq: Two times a day (BID) | ORAL | Status: DC
  Administered 2024-03-17 – 2024-03-20 (×8): 8.6 mg via ORAL
  Filled 2024-03-17 (×8): qty 1

## 2024-03-17 MED ORDER — ENOXAPARIN SODIUM 30 MG/0.3ML IJ SOSY
30.0000 mg | PREFILLED_SYRINGE | Freq: Two times a day (BID) | INTRAMUSCULAR | Status: DC
  Administered 2024-03-18 – 2024-03-21 (×7): 30 mg via SUBCUTANEOUS
  Filled 2024-03-17 (×7): qty 0.3

## 2024-03-17 MED ORDER — ONDANSETRON HCL 4 MG/2ML IJ SOLN: 4.0000 mg | Freq: Four times a day (QID) | INTRAMUSCULAR | Status: DC | PRN

## 2024-03-17 MED ORDER — LORAZEPAM 2 MG/ML IJ SOLN: 1.0000 mg | INTRAMUSCULAR | Status: DC | PRN

## 2024-03-17 MED ORDER — ACETAMINOPHEN 500 MG PO TABS
1000.0000 mg | ORAL_TABLET | Freq: Four times a day (QID) | ORAL | Status: DC
  Administered 2024-03-17 – 2024-03-21 (×15): 1000 mg via ORAL
  Filled 2024-03-17 (×17): qty 2

## 2024-03-17 MED ORDER — ONDANSETRON 4 MG PO TBDP: 4.0000 mg | ORAL_TABLET | Freq: Four times a day (QID) | ORAL | Status: DC | PRN

## 2024-03-17 MED ORDER — OXYCODONE HCL 5 MG PO TABS
5.0000 mg | ORAL_TABLET | ORAL | Status: DC | PRN
  Administered 2024-03-17 – 2024-03-19 (×5): 10 mg via ORAL
  Filled 2024-03-17 (×5): qty 2

## 2024-03-17 MED ORDER — LACTATED RINGERS IV SOLN: INTRAVENOUS | Status: AC

## 2024-03-17 MED ORDER — PHENOBARBITAL 32.4 MG PO TABS
64.8000 mg | ORAL_TABLET | Freq: Three times a day (TID) | ORAL | Status: AC
  Administered 2024-03-18 – 2024-03-20 (×6): 64.8 mg via ORAL
  Filled 2024-03-17 (×6): qty 2

## 2024-03-17 MED ORDER — GUAIFENESIN ER 600 MG PO TB12
600.0000 mg | ORAL_TABLET | Freq: Two times a day (BID) | ORAL | Status: DC
  Administered 2024-03-17 – 2024-03-21 (×9): 600 mg via ORAL
  Filled 2024-03-17 (×9): qty 1

## 2024-03-17 MED ORDER — NICOTINE 14 MG/24HR TD PT24
14.0000 mg | MEDICATED_PATCH | Freq: Every day | TRANSDERMAL | Status: DC
  Administered 2024-03-17 – 2024-03-21 (×5): 14 mg via TRANSDERMAL
  Filled 2024-03-17 (×5): qty 1

## 2024-03-17 MED ORDER — PHENOBARBITAL 32.4 MG PO TABS
32.4000 mg | ORAL_TABLET | Freq: Three times a day (TID) | ORAL | Status: DC
  Administered 2024-03-20 – 2024-03-21 (×3): 32.4 mg via ORAL
  Filled 2024-03-17 (×3): qty 1

## 2024-03-17 MED ORDER — MORPHINE SULFATE (PF) 4 MG/ML IV SOLN
4.0000 mg | INTRAVENOUS | Status: DC | PRN
  Administered 2024-03-17: 4 mg via INTRAVENOUS
  Filled 2024-03-17: qty 1

## 2024-03-17 MED ORDER — HYDRALAZINE HCL 20 MG/ML IJ SOLN: 10.0000 mg | INTRAMUSCULAR | Status: DC | PRN

## 2024-03-17 MED ORDER — IPRATROPIUM-ALBUTEROL 0.5-2.5 (3) MG/3ML IN SOLN
3.0000 mL | RESPIRATORY_TRACT | Status: DC
  Administered 2024-03-17 – 2024-03-18 (×8): 3 mL via RESPIRATORY_TRACT
  Filled 2024-03-17 (×8): qty 3

## 2024-03-17 MED ORDER — METHOCARBAMOL 500 MG PO TABS
1000.0000 mg | ORAL_TABLET | Freq: Four times a day (QID) | ORAL | Status: DC
  Administered 2024-03-17 – 2024-03-21 (×17): 1000 mg via ORAL
  Filled 2024-03-17 (×17): qty 2

## 2024-03-17 MED ORDER — GABAPENTIN 300 MG PO CAPS
300.0000 mg | ORAL_CAPSULE | Freq: Three times a day (TID) | ORAL | Status: DC
  Administered 2024-03-17 – 2024-03-21 (×14): 300 mg via ORAL
  Filled 2024-03-17 (×14): qty 1

## 2024-03-17 MED ORDER — LIDOCAINE 5 % EX PTCH
2.0000 | MEDICATED_PATCH | CUTANEOUS | Status: DC
  Administered 2024-03-17 – 2024-03-20 (×5): 2 via TRANSDERMAL
  Filled 2024-03-17 (×5): qty 2
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
## 2024-03-17 NOTE — Progress Notes (Signed)
" °   03/17/24 1603  Assess: MEWS Score  Temp 98.1 F (36.7 C)  BP (!) 132/97  MAP (mmHg) 109  Pulse Rate (!) 124  Resp 18  Level of Consciousness Alert  SpO2 96 %  Assess: MEWS Score  MEWS Temp 0  MEWS Systolic 0  MEWS Pulse 2  MEWS RR 0  MEWS LOC 0  MEWS Score 2  MEWS Score Color Yellow  Assess: if the MEWS score is Yellow or Red  Were vital signs accurate and taken at a resting state? Yes  Does the patient meet 2 or more of the SIRS criteria? No  MEWS guidelines implemented  Yes, yellow  Treat  MEWS Interventions Considered administering scheduled or prn medications/treatments as ordered  Take Vital Signs  Increase Vital Sign Frequency  Yellow: Q2hr x1, continue Q4hrs until patient remains green for 12hrs  Escalate  MEWS: Escalate Yellow: Discuss with charge nurse and consider notifying provider and/or RRT  Notify: Charge Nurse/RN  Name of Charge Nurse/RN Notified [facility]  Provider Notification  Provider Name/Title [facility]  Date Provider Notified 03/17/24  Time Provider Notified 1624  Method of Notification Page  Notification Reason Other (Comment)  Provider response No new orders  Date of Provider Response 03/17/24  Time of Provider Response 1624  Assess: SIRS CRITERIA  SIRS Temperature  0  SIRS Respirations  0  SIRS Pulse 1  SIRS WBC 0  SIRS Score Sum  1    "

## 2024-03-17 NOTE — Progress Notes (Signed)
 Consult received for left clavicle fx and left scapula fx.  CT reviewed.  I ordered formal x-rays.  Plan nonop, NWB LUE in sling.  Formal consult in am.
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
## 2024-03-17 NOTE — Progress Notes (Signed)
 Pt arrived to 6 north room 3 via stretch my transportation staff. Pt ambulated from stretcher to bed in room with stand by assist. Sling in place. Pain level 9/10 with movement. Pt adjusted in bed. Bed in lowest position. Call light in reach. Bed alarm on. All needs met at this time.

## 2024-03-17 NOTE — Progress Notes (Signed)
 respiratory culture unable to be collected due to pt not being able to cough due to his pain level. Will pass along to night shift to try to collect. Almarie, [place] aware.
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
## 2024-03-17 NOTE — Progress Notes (Signed)
 PRN pain med and incentive spirometer given to pt

## 2024-03-18 ENCOUNTER — Inpatient Hospital Stay (HOSPITAL_COMMUNITY): Payer: Self-pay

## 2024-03-18 LAB — CBC
HCT: 38 % — ABNORMAL LOW (ref 39.0–52.0)
Hemoglobin: 12.7 g/dL — ABNORMAL LOW (ref 13.0–17.0)
MCH: 34 pg (ref 26.0–34.0)
MCHC: 33.4 g/dL (ref 30.0–36.0)
MCV: 101.9 fL — ABNORMAL HIGH (ref 80.0–100.0)
Platelets: 181 10*3/uL (ref 150–400)
RBC: 3.73 MIL/uL — ABNORMAL LOW (ref 4.22–5.81)
RDW: 15.2 % (ref 11.5–15.5)
WBC: 9.7 10*3/uL (ref 4.0–10.5)
nRBC: 0 % (ref 0.0–0.2)

## 2024-03-18 LAB — BASIC METABOLIC PANEL WITH GFR
Anion gap: 9 (ref 5–15)
BUN: 16 mg/dL (ref 6–20)
CO2: 25 mmol/L (ref 22–32)
Calcium: 8.9 mg/dL (ref 8.9–10.3)
Chloride: 96 mmol/L — ABNORMAL LOW (ref 98–111)
Creatinine, Ser: 1.19 mg/dL (ref 0.61–1.24)
GFR, Estimated: >60 mL/min
Glucose, Bld: 148 mg/dL — ABNORMAL HIGH (ref 70–99)
Potassium: 4.3 mmol/L (ref 3.5–5.1)
Sodium: 130 mmol/L — ABNORMAL LOW (ref 135–145)

## 2024-03-18 MED ORDER — IPRATROPIUM-ALBUTEROL 0.5-2.5 (3) MG/3ML IN SOLN
3.0000 mL | Freq: Three times a day (TID) | RESPIRATORY_TRACT | Status: DC
  Administered 2024-03-18: 3 mL via RESPIRATORY_TRACT
  Filled 2024-03-18: qty 3

## 2024-03-18 MED ORDER — IPRATROPIUM-ALBUTEROL 0.5-2.5 (3) MG/3ML IN SOLN: 3.0000 mL | RESPIRATORY_TRACT | Status: DC | PRN

## 2024-03-18 MED ORDER — IPRATROPIUM-ALBUTEROL 0.5-2.5 (3) MG/3ML IN SOLN
3.0000 mL | Freq: Three times a day (TID) | RESPIRATORY_TRACT | Status: DC
  Administered 2024-03-19 (×3): 3 mL via RESPIRATORY_TRACT
  Filled 2024-03-18 (×4): qty 3

## 2024-03-18 MED ORDER — SODIUM CHLORIDE 0.9 % IV BOLUS
1000.0000 mL | Freq: Once | INTRAVENOUS | Status: AC
  Administered 2024-03-18: 1000 mL via INTRAVENOUS

## 2024-03-18 MED ORDER — IPRATROPIUM-ALBUTEROL 0.5-2.5 (3) MG/3ML IN SOLN: 3.0000 mL | Freq: Four times a day (QID) | RESPIRATORY_TRACT | Status: DC | PRN
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
## 2024-03-18 NOTE — Plan of Care (Signed)

## 2024-03-18 NOTE — Progress Notes (Signed)
 PT Cancellation Note  Patient Details Name: Todd Hunt MRN: [id number] DOB: 08-29-1975   Cancelled Treatment:    Reason Eval/Treat Not Completed: (P) Other (comment) (pt had CXR to assess for PE, results not yet in.) Will continue efforts per PT plan of care as schedule permits once results are in to ensure pt safety.    [name] 03/18/2024, 5:04 PM
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
## 2024-03-18 NOTE — Progress Notes (Addendum)
 Went to check on this patient around 1530. He is comfortable at rest but has pain in left chest wall with movement or deep breathing. He remains on 2L O2 with sats 94% at rest. HR 109 bpm at rest. Lungs CTAB with some mild wheezing. Duonebs ordered.   Given persistent oxygen requirement and tachycardia, even on lopressor , I think hey may need CTA chest to exclude PE. Will discuss with my attending and then order.   Almarie Pringle, PA-C [facility] Please see Amion for pager number during day hours 7:00am-4:30pm

## 2024-03-19 ENCOUNTER — Inpatient Hospital Stay (HOSPITAL_COMMUNITY): Payer: Self-pay

## 2024-03-19 DIAGNOSIS — M7989 Other specified soft tissue disorders: Secondary | ICD-10-CM

## 2024-03-19 LAB — BASIC METABOLIC PANEL WITH GFR
Anion gap: 10 (ref 5–15)
BUN: 11 mg/dL (ref 6–20)
CO2: 26 mmol/L (ref 22–32)
Calcium: 8.6 mg/dL — ABNORMAL LOW (ref 8.9–10.3)
Chloride: 98 mmol/L (ref 98–111)
Creatinine, Ser: 0.88 mg/dL (ref 0.61–1.24)
GFR, Estimated: >60 mL/min
Glucose, Bld: 91 mg/dL (ref 70–99)
Potassium: 3.9 mmol/L (ref 3.5–5.1)
Sodium: 134 mmol/L — ABNORMAL LOW (ref 135–145)

## 2024-03-19 MED ORDER — IOHEXOL 350 MG/ML SOLN
75.0000 mL | Freq: Once | INTRAVENOUS | Status: AC | PRN
  Administered 2024-03-19: 75 mL via INTRAVENOUS

## 2024-03-19 NOTE — Progress Notes (Signed)
 Bilateral lower extremity venous duplex has been completed. Preliminary results can be found in CV Proc through chart review.   03/19/24 11:09 AM Cathlyn Collet RVT

## 2024-03-19 NOTE — Progress Notes (Signed)
 Pt transported to CT ?
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
## 2024-03-19 NOTE — Progress Notes (Signed)
 PT Cancellation Note  Patient Details Name: Todd Hunt MRN: [id number] DOB: 17-May-1975   Cancelled Treatment:    Reason Eval/Treat Not Completed: (P) Patient at procedure or test/unavailable (pt off unit for LE doppler (vasc lab)) Will continue efforts per PT plan of care as schedule permits.   [name] [name] 03/19/2024, 11:10 AM

## 2024-03-19 NOTE — Progress Notes (Signed)
" °   03/19/24 0840  Vitals  BP (!) 125/95  BP Location Right Arm  BP Method Automatic  Patient Position (if appropriate) Lying  Pulse Rate (!) 103  Resp 18  Level of Consciousness  Level of Consciousness Alert  MEWS COLOR  MEWS Score Color Green  Oxygen Therapy  SpO2 (!) 23 %  O2 Device Nasal Cannula  O2 Flow Rate (L/min) 2 L/min  Pain Assessment  Pain Scale 0-10  Pain Score 4  Pain Location Rib cage  Pain Onset With Activity  MEWS Score  MEWS Temp 0  MEWS Systolic 0  MEWS Pulse 1  MEWS RR 0  MEWS LOC 0  MEWS Score 1    "

## 2024-03-19 NOTE — Progress Notes (Signed)
" °  Progress Note   Date: 03/18/2024  Patient Name: Todd Hunt        MRN#: [id number]  Clarification of the diagnosis of AKI:   AKI on admission. Resolved with treatment.     "
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
## 2024-03-19 NOTE — Plan of Care (Signed)
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
## 2024-03-20 ENCOUNTER — Telehealth: Payer: Self-pay

## 2024-03-20 DIAGNOSIS — J90 Pleural effusion, not elsewhere classified: Secondary | ICD-10-CM

## 2024-03-20 DIAGNOSIS — S27321A Contusion of lung, unilateral, initial encounter: Secondary | ICD-10-CM

## 2024-03-20 DIAGNOSIS — S2249XA Multiple fractures of ribs, unspecified side, initial encounter for closed fracture: Secondary | ICD-10-CM

## 2024-03-20 DIAGNOSIS — R918 Other nonspecific abnormal finding of lung field: Secondary | ICD-10-CM

## 2024-03-20 MED ORDER — UMECLIDINIUM-VILANTEROL 62.5-25 MCG/ACT IN AEPB
1.0000 | INHALATION_SPRAY | Freq: Every day | RESPIRATORY_TRACT | Status: DC
  Filled 2024-03-20 (×2): qty 14

## 2024-03-20 MED ORDER — IPRATROPIUM-ALBUTEROL 0.5-2.5 (3) MG/3ML IN SOLN
3.0000 mL | Freq: Once | RESPIRATORY_TRACT | Status: AC
  Administered 2024-03-20: 3 mL via RESPIRATORY_TRACT
  Filled 2024-03-20: qty 3

## 2024-03-20 MED ORDER — ALBUTEROL SULFATE (2.5 MG/3ML) 0.083% IN NEBU: 3.0000 mL | INHALATION_SOLUTION | Freq: Four times a day (QID) | RESPIRATORY_TRACT | Status: DC | PRN

## 2024-03-20 NOTE — Discharge Instructions (Signed)
 In a time of Crisis: Integrated family Services/Therapeutic Alternatives.  Mobile Crisis Management provides immediate crisis response, 24/7.  Call [phone] Depending on the county.  [facility] for MH/DD/[facility] is available 24 hours a day, 7 days a week. Customer Service Specialists will assist you to find a crisis provider that is well-matched with your needs. Your local number is: [phone]  [facility] ([facility]) IOP, individual counseling, medication management [phone] [place], [place] [address] [phone] Call for intake hours; Medicaid and Uninsured    Outpatient Providers  [facility] ([facility]) Group and individual counseling. [address]  [place], [place] [address] [phone] [place]: [phone]  [place]: [phone] Medicaid and uninsured.   [facility] Offers IOP groups multiple times per week. [address] [place], [place] [address] [phone] Takes Medicaid and other insurances.   [facility]  Chemical Dependency Intensive Outpatient Program (IOP) [address] [place], [place] [address] [phone] Takes Nurse, learning disability and [place].   [facility]  IOP and Partial Hospitalization Program  [address]  [place], [place] [address] [phone] Private Insurance, [place] only for partial hospitalization  [facility] [address], [place], [place] [address] [phone] Monday-Friday. Short and Long term options. [facility] ([facility]) IOP, individual counseling, medication management [address] [place], [place] [address] [phone] Medicaid and [facility]  [facility] [address]  [place], [place] [address] [phone] Private Insurance and Self Pay    [facility] Out[facility] [address]  [place], [place] [address] [phone] Private Insurance, [place], and Self Pay   [facility]  [address], [place] [address] [facility]  [address]  [place], [place] [address] [phone]  [facility]  [address] [place], [place] [address] [phone]  [facility]  Locations in [place], [place], [place], [place].  [phone] Accepts all insurances and some uninsured.   Residential Treatment Programs  [facility] [address] [place], [place] [address] [phone] or [phone] Detox and Residential Rehab 21 days (Medicaid, private insurance, and self pay. If Medicare, will look into funding). No methadone. Call for pre-screen.   [facility]) [address]  [place], [place] [address] [phone] Detox 3-7 days (self Pay and Medicaid Limited). Transitional Program for females needs 60 days clean first.  Rehab Only for Males 60 days (Medicare, and Self Pay)-No methadone.  [facility] [address] [place], [place] [address] [phone] or [phone] Private Insurance only  [facility] PHONE: [phone] FAX: [phone] Residential program for women 21 and over for up to a year through a Christian 12-step recovery model. Self-pay.    [facility] [address] [place], [place] [address] Phone:  [phone] Must be detoxed 72 hours prior to admission; 28 day program.  Self-pay.  [facility] [address]  [place], [place] [phone] [employer], Medicare, [place] (not straight [place]). They offer assistance with transportation.   [facility] [address] [place],  [place], [place] [address] [phone] Christian Based Program. Men only. No insurance  [facility] [address] [place], [place] [address] Women's: [phone] Men's: [phone] No Medicaid.   [facility]  [address]  [place], [place] [address] [phone] Treatment Only, must make assessment appointment, and must be sober for assessment appointment. Self pay, [facility], must be [facility] resident. No methadone.   [facility]  [address] [place], [place] [address] [phone] No pending legal charges, Long-term work program. No methadone. Call for assessment.  [facility]  [address], [place], [place] [address] [phone] or [phone] Commercial Insurance Only  [facility] - [phone] Private Insurance (no [place]). Males/Females, call to make referrals, multiple facilities.   [facility] [address]  [place], [place] [address]  [phone] Men Only Upfront Fee  [facility]-no methadone: [facility] [address] [place], [place] [address] [phone] ([phone] (f)  [facility] Locations across the [place] (mainly [place] ) willing to help with transportation.  [phone] [employer].     AA Meetings Meeting Locator:  [email or web address] Also can download an app on that website.  [facility] Due to COVID-19, syringe services programs are likely operating under different hours with limited or no fixed site hours. Some programs may not be operating at all. Please contact the program directly using the phone numbers provided below to see if they are still operating under COVID-19. [facility] Solution to the Opioid Problem (GCSTOP) Fixed; mobile; peer-based [place] [phone] [email or web address] Fixed site exchange at [facility], [address] [place], [place] [address] on Wednesdays (2:00 - 5:00 pm) and Thursdays  (4:00 - 8:00 pm). Pop-up mobile exchange locations: [employer], [address] [place], [place] [address] on Tuesdays (11:00 am - 1:00 pm) and Fridays (11:00 am - 1:00 pm) -[facility] - [address], [place], [place] [address] on Tuesdays (2:00 - 4:00 pm) and Fridays (2:00 - 4:00 pm) -[facility] -Fixed; mobile; peer-based [phone] [address], [place], [place] [address] Delivery and outreach available in [place] and [place], please call for more information. Monday, Tuesday: 1:00 -7:00 pm, Thursday: 4:00 pm - 8:00 pm, Friday: 1:00 pm - 8:00 pm) Medication-Assisted Treatment (MAT) -[facility]: Locations in [place], [place], [place], [place]. Accepts all insurances and some uninsured. Methadone, Suboxone, etc. Closed on weekends. Walk ins before 11am.  Call [phone]  -[facility]- services [place] and surrounding areas including [place], [place], [place], [place], [address], [address], [place], [place], [place], and [place], [place]. Options include Methadone, buprenorphine or Suboxone. [address], [place], [place] [address] Phone: [phone] [place] - Fri: 5:30am - 2:00pm, Sat: 5:30am -7:30am, Sun: Closed  -[facility]- We use FDA-approved medications, like methadone/suboxone/sublocade, and vivitrol. These medications are then combined with customized care plans that include individual or group counseling, toxicology, and medical care directed by on-site physicians. Accepts most insurance plans, Medicaid, and private pay.  [address] [place], [place] [address] Phone: [phone] Monday-Friday 5:00 AM - 10:00 AM, Saturday 6:00 AM - 8:30 AM, Sunday 6:00 AM - 7:00 AM  -[facility]- [facility] is a treatment & recovery focused program. In addition to receiving methadone medication, our clients participate in individual and group counseling as well as random  drug testing. If accepted into the [facility] Opioid Program, you will be provided several intake appointments and a physical exam [address] [place], [place] [address] Office: [phone]  Fax: [phone]  -[facility]- We put our community members at the  center of everything we do, for remote treatment services as well as in-person, from alcohol withdrawal to opioid use and more.  [address], [place], [place] [address] [phone] Monday-Wednesday: 9:00am - 5:00pm, Thursday: 9:00am - 6:00pm, Friday: 9:00am - 5:00pm Saturday: 9:00am - 1:00pm, Sunday: Closed  -[facility]- Our clinic in [place], a medication unit, offers daily dosing of methadone or buprenorphine in addition to our clinic in [place] offering counseling to help people overcome addiction to heroin and other opiates. We also offer psychiatric services including medication management, and an office based suboxone program. [address], [place], [place] [address] Phone [phone]  [facility]-We treat Opioid Addiction using medications that are a combination of buprenorphine and naloxone, which are used to treat adults addicted to narcotic painkillers and drugs such as heroin. They reduce the intense cravings and painful symptoms that accompany withdrawal. [address], [place], [place] Phone: [phone]  -[facility] (Or [place]): $12/daily for Methadone Treatment.  [address], [place], [place] [address] [phone] [address] [place], [place] [address]  -[facility]: Suboxone only.  [address], [place], [place] [address] P. [phone]  -[facility] [address] [place], [place] [address] P. [phone]
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
## 2024-03-20 NOTE — TOC CM/SW Note (Signed)
 Transition of Care (TOC) CM/SW Note  SA consult - resources on AVS.
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
## 2024-03-20 NOTE — Plan of Care (Signed)

## 2024-03-20 NOTE — Telephone Encounter (Signed)
 Outpatient follow up requested

## 2024-03-21 ENCOUNTER — Other Ambulatory Visit (HOSPITAL_COMMUNITY): Payer: Self-pay

## 2024-03-21 MED ORDER — ALBUTEROL SULFATE HFA 108 (90 BASE) MCG/ACT IN AERS
1.0000 | INHALATION_SPRAY | Freq: Four times a day (QID) | RESPIRATORY_TRACT | 0 refills | Status: AC | PRN
  Filled 2024-03-21: qty 6.7, 30d supply, fill #0

## 2024-03-21 MED ORDER — LIDOCAINE 5 % EX PTCH: 2.0000 | MEDICATED_PATCH | CUTANEOUS | Status: AC

## 2024-03-21 MED ORDER — UMECLIDINIUM-VILANTEROL 62.5-25 MCG/ACT IN AEPB
1.0000 | INHALATION_SPRAY | Freq: Every day | RESPIRATORY_TRACT | 0 refills | Status: AC
  Filled 2024-03-21: qty 60, 30d supply, fill #0

## 2024-03-21 MED ORDER — GABAPENTIN 300 MG PO CAPS
300.0000 mg | ORAL_CAPSULE | Freq: Three times a day (TID) | ORAL | 0 refills | Status: AC | PRN
  Filled 2024-03-21: qty 30, 10d supply, fill #0

## 2024-03-21 MED ORDER — METHOCARBAMOL 500 MG PO TABS
1000.0000 mg | ORAL_TABLET | Freq: Four times a day (QID) | ORAL | 0 refills | Status: AC | PRN
  Filled 2024-03-21: qty 60, 8d supply, fill #0

## 2024-03-21 MED ORDER — OXYCODONE HCL 5 MG PO TABS
5.0000 mg | ORAL_TABLET | ORAL | 0 refills | Status: AC | PRN
  Filled 2024-03-21: qty 30, 3d supply, fill #0

## 2024-03-21 MED ORDER — ALBUTEROL SULFATE HFA 108 (90 BASE) MCG/ACT IN AERS
1.0000 | INHALATION_SPRAY | Freq: Four times a day (QID) | RESPIRATORY_TRACT | Status: DC | PRN
  Filled 2024-03-21: qty 6.7

## 2024-03-21 MED ORDER — GUAIFENESIN ER 600 MG PO TB12: 600.0000 mg | ORAL_TABLET | Freq: Two times a day (BID) | ORAL | Status: AC | PRN
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
## 2024-03-21 NOTE — Telephone Encounter (Signed)
 ATCx1 LVMTCB to schedule HFU per Dr. Theodoro. Routing to [employer].
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
## 2024-03-26 NOTE — Telephone Encounter (Signed)
 I attempted to call again and someone answered the mobile number but said that we had the wrong number, I took off file and tried to call the home number and was unable to leave a message
